# Patient Record
Sex: Female | Born: 1970 | Race: White | Hispanic: No | Marital: Married | Smoking: Current every day smoker
Health system: Southern US, Community
[De-identification: ages and names within clinical notes are randomized; demographics above are authoritative.]

## PROBLEM LIST (undated history)

## (undated) DIAGNOSIS — E039 Hypothyroidism, unspecified: Secondary | ICD-10-CM

## (undated) DIAGNOSIS — Z720 Tobacco use: Secondary | ICD-10-CM

## (undated) DIAGNOSIS — K279 Peptic ulcer, site unspecified, unspecified as acute or chronic, without hemorrhage or perforation: Secondary | ICD-10-CM

## (undated) DIAGNOSIS — Z8639 Personal history of other endocrine, nutritional and metabolic disease: Secondary | ICD-10-CM

## (undated) DIAGNOSIS — IMO0002 Reserved for concepts with insufficient information to code with codable children: Secondary | ICD-10-CM

## (undated) DIAGNOSIS — J358 Other chronic diseases of tonsils and adenoids: Secondary | ICD-10-CM

## (undated) DIAGNOSIS — S82899A Other fracture of unspecified lower leg, initial encounter for closed fracture: Secondary | ICD-10-CM

## (undated) DIAGNOSIS — K589 Irritable bowel syndrome without diarrhea: Secondary | ICD-10-CM

## (undated) DIAGNOSIS — M1990 Unspecified osteoarthritis, unspecified site: Secondary | ICD-10-CM

## (undated) DIAGNOSIS — G4700 Insomnia, unspecified: Secondary | ICD-10-CM

## (undated) DIAGNOSIS — K625 Hemorrhage of anus and rectum: Secondary | ICD-10-CM

## (undated) DIAGNOSIS — G43909 Migraine, unspecified, not intractable, without status migrainosus: Secondary | ICD-10-CM

## (undated) DIAGNOSIS — K219 Gastro-esophageal reflux disease without esophagitis: Secondary | ICD-10-CM

## (undated) DIAGNOSIS — F319 Bipolar disorder, unspecified: Secondary | ICD-10-CM

## (undated) DIAGNOSIS — J449 Chronic obstructive pulmonary disease, unspecified: Secondary | ICD-10-CM

## (undated) DIAGNOSIS — R1031 Right lower quadrant pain: Secondary | ICD-10-CM

## (undated) HISTORY — DX: Migraine, unspecified, not intractable, without status migrainosus: G43.909

## (undated) HISTORY — DX: Bipolar disorder, unspecified: F31.9

## (undated) HISTORY — DX: Gastro-esophageal reflux disease without esophagitis: K21.9

## (undated) HISTORY — DX: Peptic ulcer, site unspecified, unspecified as acute or chronic, without hemorrhage or perforation: K27.9

## (undated) HISTORY — DX: Hemorrhage of anus and rectum: K62.5

## (undated) HISTORY — DX: Hypothyroidism, unspecified: E03.9

## (undated) HISTORY — PX: MOUTH SURGERY: SHX715

## (undated) HISTORY — DX: Reserved for concepts with insufficient information to code with codable children: IMO0002

## (undated) HISTORY — DX: Other fracture of unspecified lower leg, initial encounter for closed fracture: S82.899A

## (undated) HISTORY — DX: Irritable bowel syndrome, unspecified: K58.9

## (undated) HISTORY — DX: Right lower quadrant pain: R10.31

## (undated) HISTORY — DX: Insomnia, unspecified: G47.00

## (undated) HISTORY — DX: Tobacco use: Z72.0

## (undated) HISTORY — DX: Other chronic diseases of tonsils and adenoids: J35.8

## (undated) HISTORY — DX: Personal history of other endocrine, nutritional and metabolic disease: Z86.39

## (undated) HISTORY — DX: Chronic obstructive pulmonary disease, unspecified: J44.9

## (undated) HISTORY — DX: Unspecified osteoarthritis, unspecified site: M19.90

---

## 2001-11-14 HISTORY — PX: ABDOMINAL HYSTERECTOMY: SHX81

## 2001-11-14 HISTORY — PX: CARPAL TUNNEL RELEASE: SHX101

## 2002-01-09 ENCOUNTER — Ambulatory Visit (HOSPITAL_COMMUNITY): Admission: RE | Admit: 2002-01-09 | Discharge: 2002-01-09 | Payer: Self-pay | Admitting: Family Medicine

## 2002-01-09 ENCOUNTER — Encounter: Payer: Self-pay | Admitting: Family Medicine

## 2002-02-02 ENCOUNTER — Emergency Department (HOSPITAL_COMMUNITY): Admission: EM | Admit: 2002-02-02 | Discharge: 2002-02-02 | Payer: Self-pay | Admitting: Emergency Medicine

## 2002-02-02 ENCOUNTER — Encounter: Payer: Self-pay | Admitting: Emergency Medicine

## 2002-02-12 ENCOUNTER — Ambulatory Visit (HOSPITAL_COMMUNITY): Admission: RE | Admit: 2002-02-12 | Discharge: 2002-02-12 | Payer: Self-pay | Admitting: Endocrinology

## 2002-04-19 ENCOUNTER — Ambulatory Visit (HOSPITAL_COMMUNITY): Admission: RE | Admit: 2002-04-19 | Discharge: 2002-04-19 | Payer: Self-pay | Admitting: General Surgery

## 2002-05-20 ENCOUNTER — Emergency Department (HOSPITAL_COMMUNITY): Admission: EM | Admit: 2002-05-20 | Discharge: 2002-05-20 | Payer: Self-pay | Admitting: *Deleted

## 2002-05-21 ENCOUNTER — Emergency Department (HOSPITAL_COMMUNITY): Admission: EM | Admit: 2002-05-21 | Discharge: 2002-05-21 | Payer: Self-pay | Admitting: Emergency Medicine

## 2002-05-22 ENCOUNTER — Ambulatory Visit (HOSPITAL_COMMUNITY): Admission: RE | Admit: 2002-05-22 | Discharge: 2002-05-22 | Payer: Self-pay | Admitting: General Surgery

## 2002-07-18 ENCOUNTER — Encounter: Payer: Self-pay | Admitting: Family Medicine

## 2002-07-18 ENCOUNTER — Ambulatory Visit (HOSPITAL_COMMUNITY): Admission: RE | Admit: 2002-07-18 | Discharge: 2002-07-18 | Payer: Self-pay | Admitting: Family Medicine

## 2002-08-07 ENCOUNTER — Encounter: Payer: Self-pay | Admitting: Family Medicine

## 2002-08-07 ENCOUNTER — Ambulatory Visit (HOSPITAL_COMMUNITY): Admission: RE | Admit: 2002-08-07 | Discharge: 2002-08-07 | Payer: Self-pay | Admitting: Family Medicine

## 2002-08-09 ENCOUNTER — Inpatient Hospital Stay (HOSPITAL_COMMUNITY): Admission: RE | Admit: 2002-08-09 | Discharge: 2002-08-10 | Payer: Self-pay | Admitting: General Surgery

## 2003-01-27 ENCOUNTER — Encounter: Payer: Self-pay | Admitting: Family Medicine

## 2003-01-27 ENCOUNTER — Ambulatory Visit (HOSPITAL_COMMUNITY): Admission: RE | Admit: 2003-01-27 | Discharge: 2003-01-27 | Payer: Self-pay | Admitting: Family Medicine

## 2003-02-03 ENCOUNTER — Ambulatory Visit (HOSPITAL_COMMUNITY): Admission: RE | Admit: 2003-02-03 | Discharge: 2003-02-03 | Payer: Self-pay | Admitting: *Deleted

## 2003-03-13 ENCOUNTER — Encounter: Payer: Self-pay | Admitting: Emergency Medicine

## 2003-03-13 ENCOUNTER — Emergency Department (HOSPITAL_COMMUNITY): Admission: EM | Admit: 2003-03-13 | Discharge: 2003-03-13 | Payer: Self-pay | Admitting: Emergency Medicine

## 2003-03-18 ENCOUNTER — Ambulatory Visit (HOSPITAL_COMMUNITY): Admission: RE | Admit: 2003-03-18 | Discharge: 2003-03-18 | Payer: Self-pay | Admitting: *Deleted

## 2003-04-25 ENCOUNTER — Encounter (HOSPITAL_COMMUNITY): Admission: RE | Admit: 2003-04-25 | Discharge: 2003-05-25 | Payer: Self-pay | Admitting: Family Medicine

## 2003-04-25 ENCOUNTER — Encounter: Payer: Self-pay | Admitting: Family Medicine

## 2003-07-22 ENCOUNTER — Ambulatory Visit (HOSPITAL_COMMUNITY): Admission: RE | Admit: 2003-07-22 | Discharge: 2003-07-22 | Payer: Self-pay | Admitting: Family Medicine

## 2003-07-22 ENCOUNTER — Encounter: Payer: Self-pay | Admitting: Family Medicine

## 2003-08-06 ENCOUNTER — Emergency Department (HOSPITAL_COMMUNITY): Admission: EM | Admit: 2003-08-06 | Discharge: 2003-08-06 | Payer: Self-pay | Admitting: *Deleted

## 2003-08-06 ENCOUNTER — Encounter: Payer: Self-pay | Admitting: *Deleted

## 2003-08-12 ENCOUNTER — Ambulatory Visit (HOSPITAL_COMMUNITY): Admission: RE | Admit: 2003-08-12 | Discharge: 2003-08-12 | Payer: Self-pay | Admitting: General Surgery

## 2003-08-28 ENCOUNTER — Emergency Department (HOSPITAL_COMMUNITY): Admission: EM | Admit: 2003-08-28 | Discharge: 2003-08-28 | Payer: Self-pay | Admitting: Emergency Medicine

## 2003-08-29 ENCOUNTER — Emergency Department (HOSPITAL_COMMUNITY): Admission: EM | Admit: 2003-08-29 | Discharge: 2003-08-29 | Payer: Self-pay | Admitting: Emergency Medicine

## 2003-09-25 ENCOUNTER — Ambulatory Visit (HOSPITAL_COMMUNITY): Admission: RE | Admit: 2003-09-25 | Discharge: 2003-09-25 | Payer: Self-pay | Admitting: Unknown Physician Specialty

## 2003-11-15 HISTORY — PX: THYROIDECTOMY: SHX17

## 2003-11-15 HISTORY — PX: BILATERAL SALPINGOOPHORECTOMY: SHX1223

## 2003-11-19 ENCOUNTER — Emergency Department (HOSPITAL_COMMUNITY): Admission: EM | Admit: 2003-11-19 | Discharge: 2003-11-19 | Payer: Self-pay | Admitting: Emergency Medicine

## 2003-12-05 ENCOUNTER — Ambulatory Visit (HOSPITAL_COMMUNITY): Admission: RE | Admit: 2003-12-05 | Discharge: 2003-12-05 | Payer: Self-pay | Admitting: Family Medicine

## 2003-12-16 ENCOUNTER — Ambulatory Visit (HOSPITAL_COMMUNITY): Admission: RE | Admit: 2003-12-16 | Discharge: 2003-12-16 | Payer: Self-pay | Admitting: General Surgery

## 2003-12-19 ENCOUNTER — Inpatient Hospital Stay (HOSPITAL_COMMUNITY): Admission: RE | Admit: 2003-12-19 | Discharge: 2003-12-20 | Payer: Self-pay | Admitting: *Deleted

## 2004-01-27 ENCOUNTER — Inpatient Hospital Stay (HOSPITAL_COMMUNITY): Admission: RE | Admit: 2004-01-27 | Discharge: 2004-01-27 | Payer: Self-pay | Admitting: General Surgery

## 2004-04-26 IMAGING — NM NM THYROID IMAGING W/ UPTAKE SINGLE (24 HR)
4 series · 4 of 4 positions shown · non-contrast
Comparison: none

CLINICAL DATA: Hx of hyperthyroidism and goiter.
THYROID UPTAKE AND SCAN
Thyroid uptake and scan was performed after the intravenous administration of approximately 10.0 mCi of Qc99m Pertechnetate and oral administration of 100 uCi of Iodine 123.  Technetium visual uptake estimate in reference to the salivary gland and 24 hour Iodine 123 uptake values were performed.  Thyroid scan in the anterior LAO and RAO projections was performed with pinhole images.
The Technetium visual uptake estimate was approximately 40%.  The 24 hour iodine 123 uptake was 21.6%.  The discrepancy could be related to a recent thyroiditis.  The images of the thyroid glands show fairly homogeneous uptake on the left side; however, there is decreased uptake in the right lower pole and clinical correlation and possible correlation with fine needle aspiration biopsy is recommended.  Both glands are enlarged measuring 6-7 cm in diameter anteriorly. 
IMPRESSION
Enlarged glands bilaterally with normal uptake of 21.6%.  However, there is a discrepancy between the Technetium and Iodine uptake which could be related to recent thyroiditis.  There is an area of decreased uptake in the right lower pole which needs clinical correlation and possible correlation with fine needle aspiration biopsy.

[Series 1: th thyroid scan · 0.54mm/px · 1 of 1 slices shown (1 of 4)]
[im 1/1]
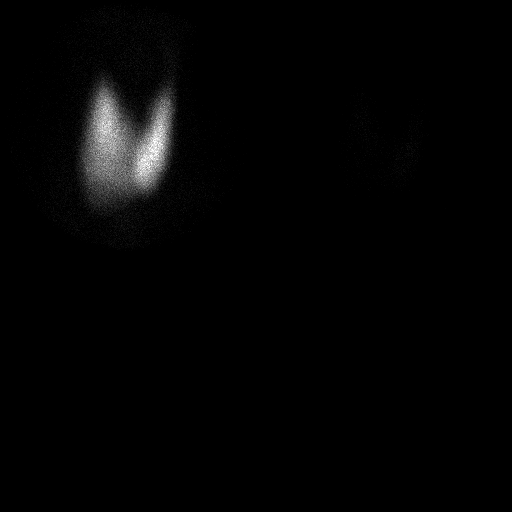

[Series 1: th thyroid scan · 0.54mm/px · 1 of 1 slices shown (2 of 4)]
[im 1/1]
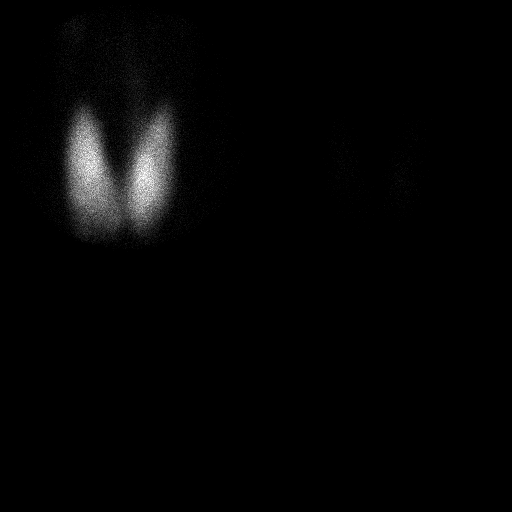

[Series 1: th thyroid scan · 0.54mm/px · 1 of 1 slices shown (3 of 4)]
[im 1/1]
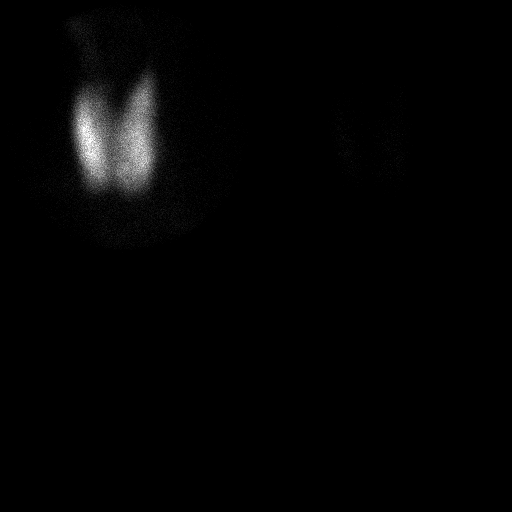

[Series 1: th thyroid scan · 2.39mm/px · 1 of 1 slices shown (4 of 4)]
[im 1/1]
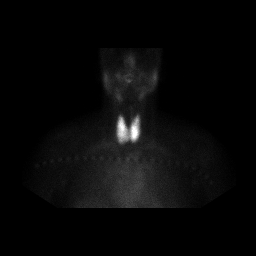

[4 of 4 positions shown; findings below may reference images not displayed]

## 2004-06-28 ENCOUNTER — Ambulatory Visit (HOSPITAL_COMMUNITY): Admission: RE | Admit: 2004-06-28 | Discharge: 2004-06-28 | Payer: Self-pay | Admitting: Family Medicine

## 2004-11-06 IMAGING — US US ABDOMEN COMPLETE
1 series · 14 of 25 positions shown · non-contrast
Comparison: none

CLINICAL DATA: Abdominal pain. 
 ABDOMEN ULTRASOUND COMPLETE 
 Gallbladder physiologically distended without stones or wall thickening.  Common bile duct normal caliber 4 mm diameter.  Liver, pancreas and kidneys normal appearance, right kidney 12.2 cm length and left kidney 13.3 cm.  Aorta and IVC unremarkable.  No free fluid.  Upper normal sized spleen 13.3 cm length.  
 IMPRESSION
 Question mild splenic prominence.  Otherwise negative exam.

[Series 1: unknown · 0.34mm/px · 14 of 65 slices shown]
[im 1/65]
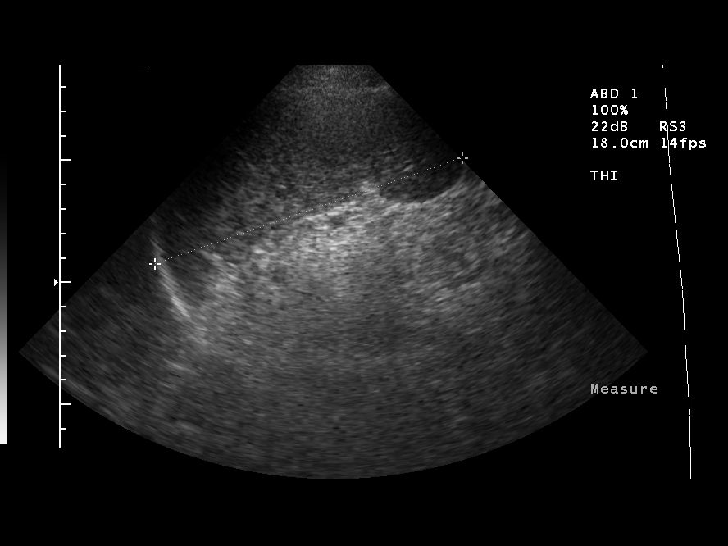
[im 6/65]
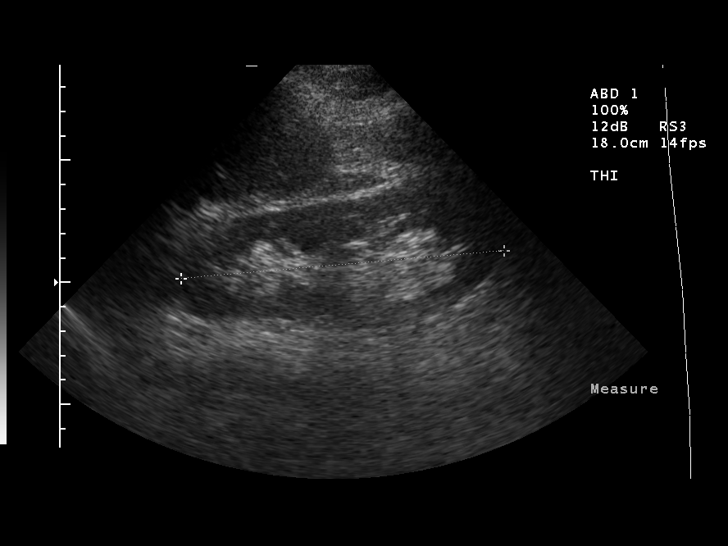
[im 11/65]
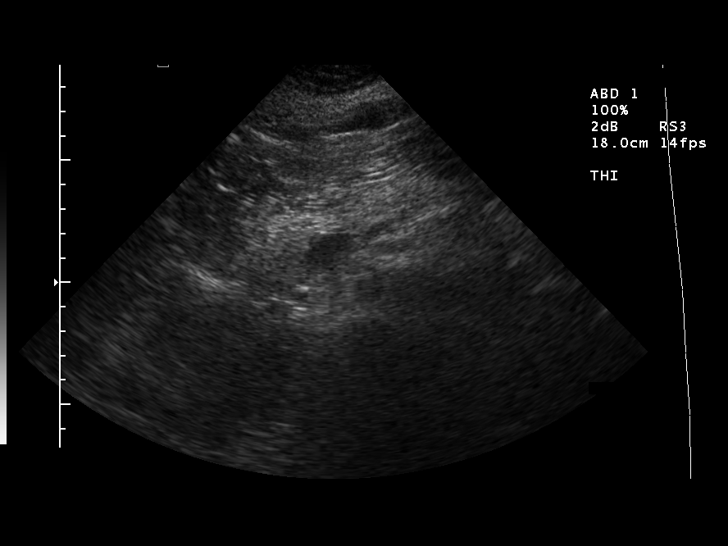
[im 17/65]
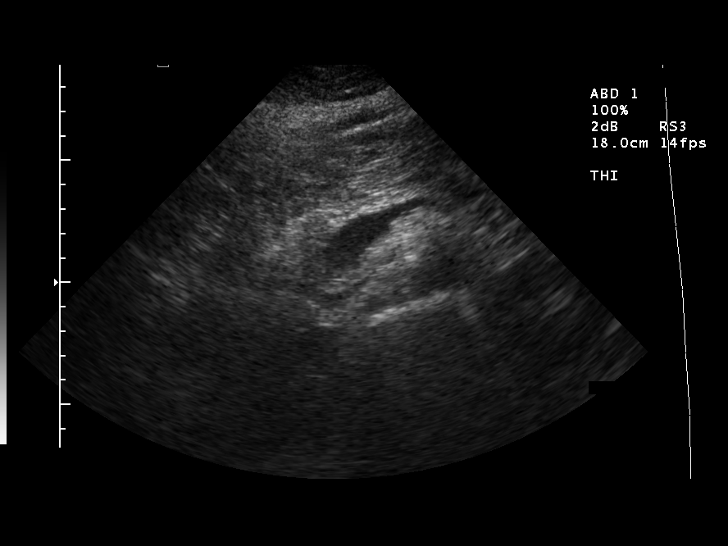
[im 22/65]
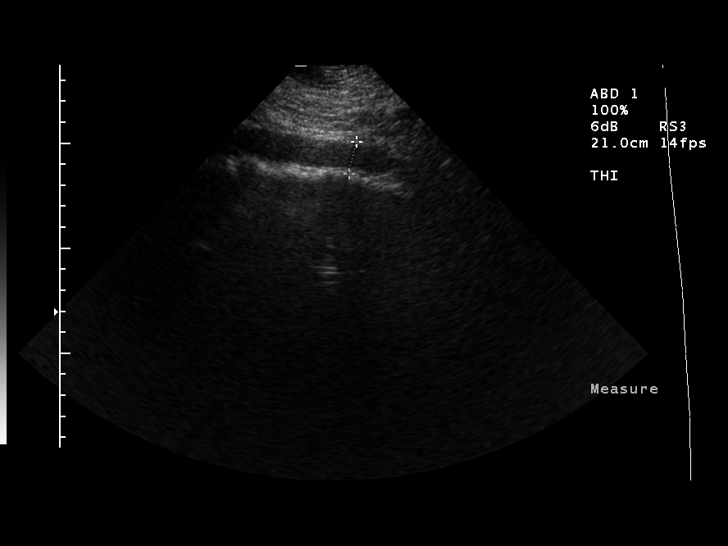
[im 25/65]
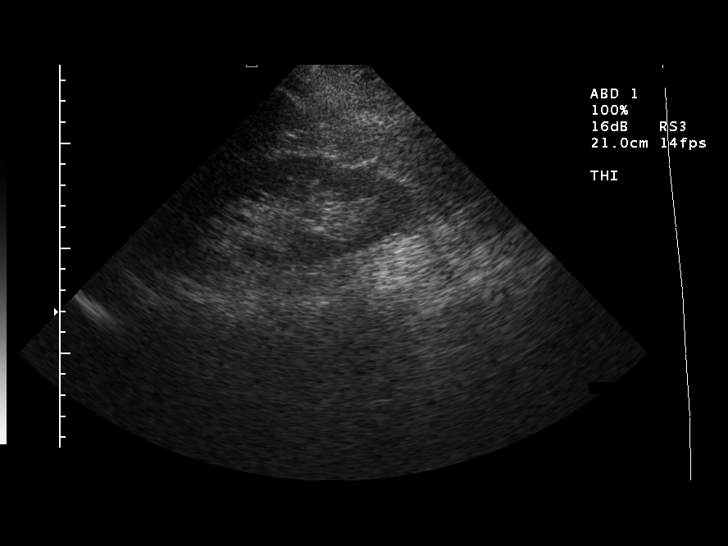
[im 30/65]
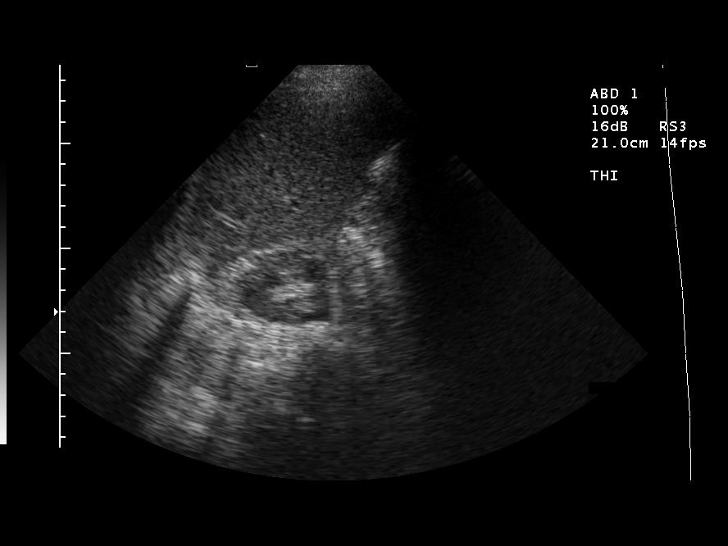
[im 35/65]
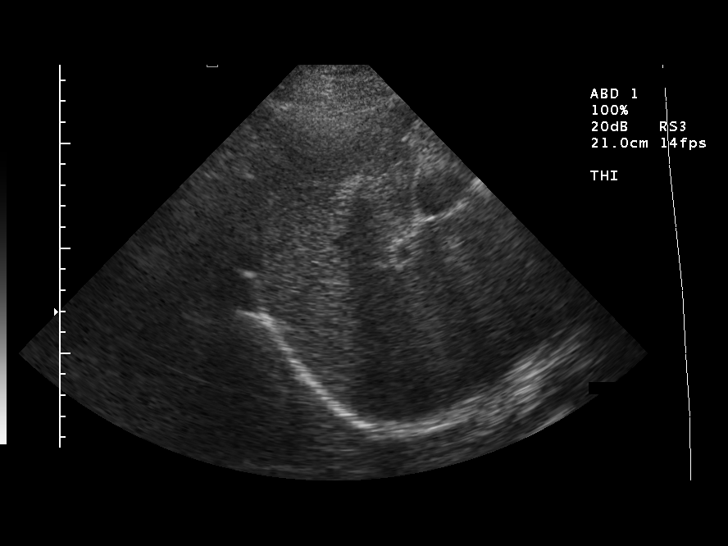
[im 41/65]
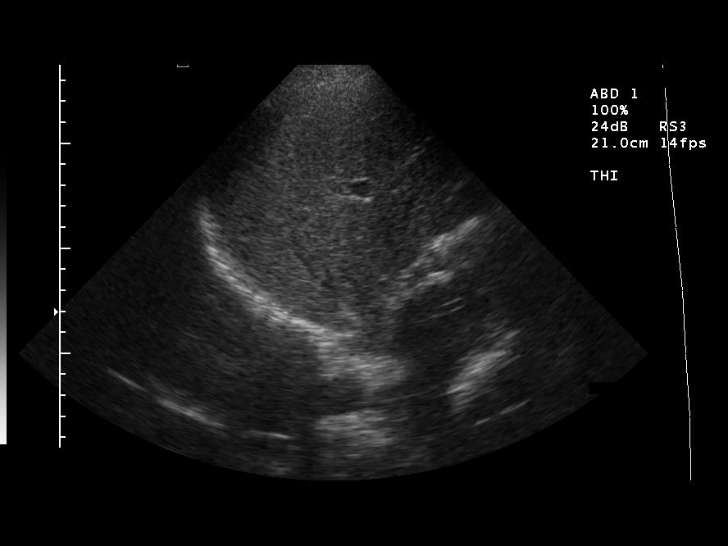
[im 43/65]
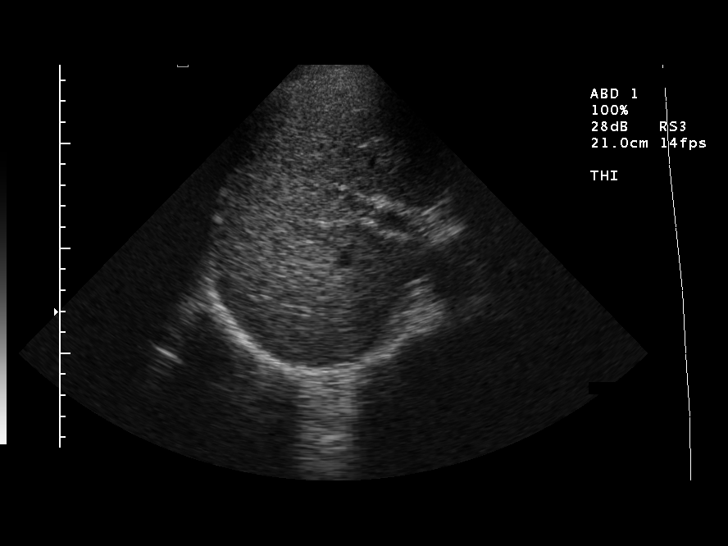
[im 49/65]
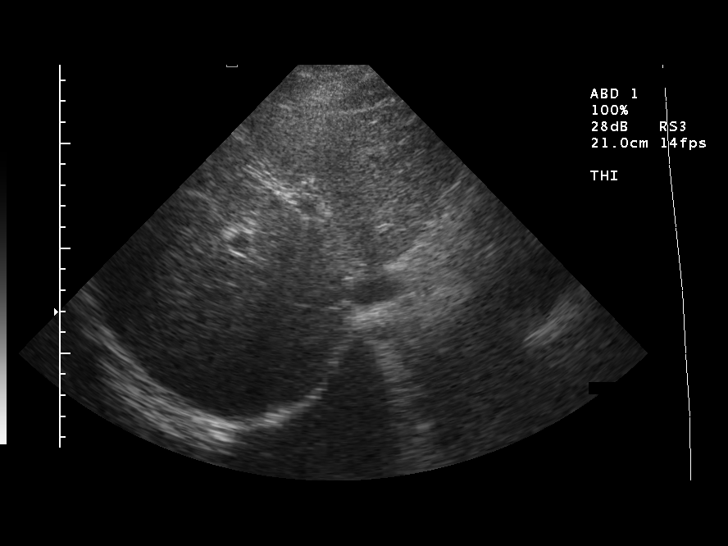
[im 54/65]
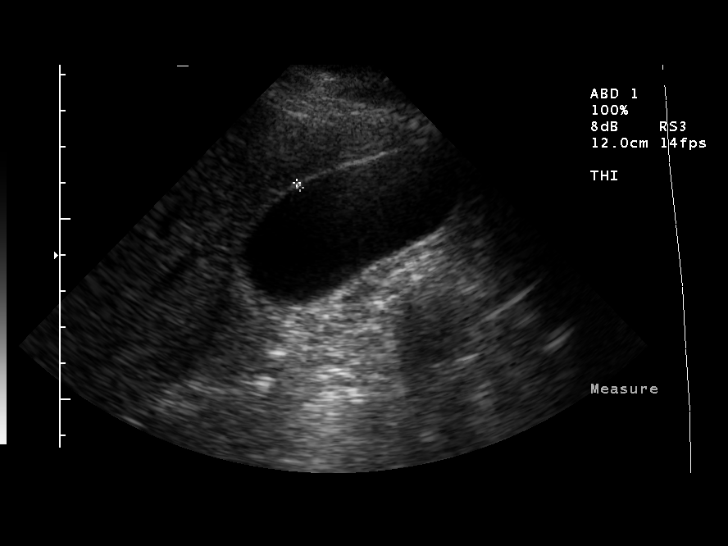
[im 59/65]
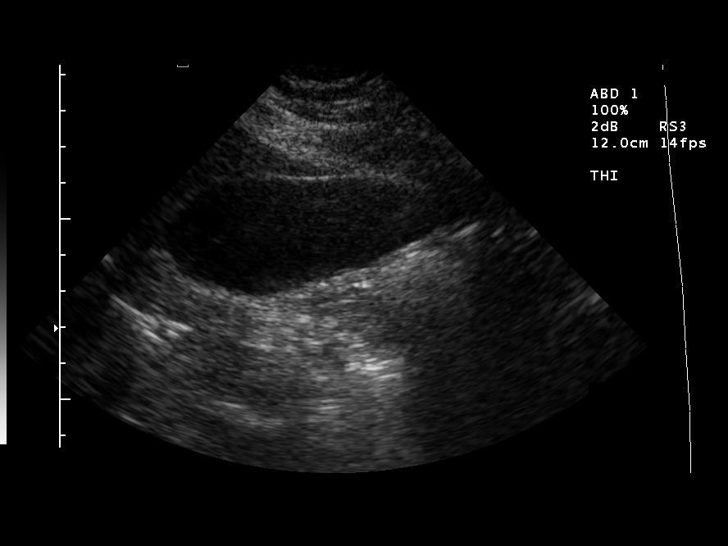
[im 65/65]
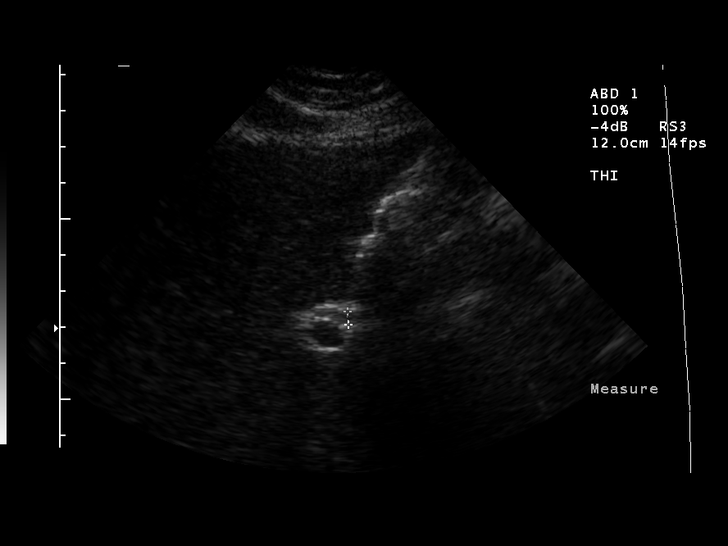

[14 of 25 positions shown; findings below may reference images not displayed]

## 2004-11-19 ENCOUNTER — Ambulatory Visit (HOSPITAL_COMMUNITY): Admission: RE | Admit: 2004-11-19 | Discharge: 2004-11-19 | Payer: Self-pay | Admitting: Family Medicine

## 2005-01-24 ENCOUNTER — Emergency Department (HOSPITAL_COMMUNITY): Admission: EM | Admit: 2005-01-24 | Discharge: 2005-01-24 | Payer: Self-pay | Admitting: Emergency Medicine

## 2005-02-03 ENCOUNTER — Ambulatory Visit: Payer: Self-pay | Admitting: Orthopedic Surgery

## 2005-02-10 ENCOUNTER — Ambulatory Visit (HOSPITAL_COMMUNITY): Admission: RE | Admit: 2005-02-10 | Discharge: 2005-02-10 | Payer: Self-pay | Admitting: Orthopedic Surgery

## 2005-02-16 ENCOUNTER — Ambulatory Visit: Payer: Self-pay | Admitting: Orthopedic Surgery

## 2005-04-15 ENCOUNTER — Ambulatory Visit: Payer: Self-pay | Admitting: Gastroenterology

## 2005-05-19 ENCOUNTER — Ambulatory Visit (HOSPITAL_COMMUNITY): Admission: RE | Admit: 2005-05-19 | Discharge: 2005-05-19 | Payer: Self-pay | Admitting: Internal Medicine

## 2005-05-19 ENCOUNTER — Ambulatory Visit: Payer: Self-pay | Admitting: Internal Medicine

## 2006-01-04 ENCOUNTER — Ambulatory Visit (HOSPITAL_COMMUNITY): Admission: RE | Admit: 2006-01-04 | Discharge: 2006-01-04 | Payer: Self-pay | Admitting: Family Medicine

## 2006-02-24 ENCOUNTER — Emergency Department (HOSPITAL_COMMUNITY): Admission: EM | Admit: 2006-02-24 | Discharge: 2006-02-25 | Payer: Self-pay | Admitting: Emergency Medicine

## 2006-05-12 ENCOUNTER — Emergency Department (HOSPITAL_COMMUNITY): Admission: EM | Admit: 2006-05-12 | Discharge: 2006-05-12 | Payer: Self-pay | Admitting: Emergency Medicine

## 2006-06-14 ENCOUNTER — Emergency Department (HOSPITAL_COMMUNITY): Admission: EM | Admit: 2006-06-14 | Discharge: 2006-06-14 | Payer: Self-pay | Admitting: Emergency Medicine

## 2006-06-16 ENCOUNTER — Emergency Department (HOSPITAL_COMMUNITY): Admission: EM | Admit: 2006-06-16 | Discharge: 2006-06-16 | Payer: Self-pay | Admitting: Emergency Medicine

## 2006-07-23 ENCOUNTER — Emergency Department (HOSPITAL_COMMUNITY): Admission: EM | Admit: 2006-07-23 | Discharge: 2006-07-23 | Payer: Self-pay | Admitting: Emergency Medicine

## 2006-09-06 ENCOUNTER — Encounter: Payer: Self-pay | Admitting: Nurse Practitioner

## 2006-11-14 HISTORY — PX: MULTIPLE TOOTH EXTRACTIONS: SHX2053

## 2006-12-12 ENCOUNTER — Emergency Department (HOSPITAL_COMMUNITY): Admission: EM | Admit: 2006-12-12 | Discharge: 2006-12-12 | Payer: Self-pay | Admitting: Emergency Medicine

## 2007-01-08 ENCOUNTER — Emergency Department (HOSPITAL_COMMUNITY): Admission: EM | Admit: 2007-01-08 | Discharge: 2007-01-09 | Payer: Self-pay | Admitting: Emergency Medicine

## 2007-01-09 ENCOUNTER — Ambulatory Visit (HOSPITAL_COMMUNITY): Admission: RE | Admit: 2007-01-09 | Discharge: 2007-01-09 | Payer: Self-pay | Admitting: Emergency Medicine

## 2007-02-05 ENCOUNTER — Emergency Department (HOSPITAL_COMMUNITY): Admission: EM | Admit: 2007-02-05 | Discharge: 2007-02-05 | Payer: Self-pay | Admitting: Emergency Medicine

## 2007-03-09 ENCOUNTER — Emergency Department (HOSPITAL_COMMUNITY): Admission: EM | Admit: 2007-03-09 | Discharge: 2007-03-09 | Payer: Self-pay | Admitting: Emergency Medicine

## 2007-04-04 ENCOUNTER — Emergency Department (HOSPITAL_COMMUNITY): Admission: EM | Admit: 2007-04-04 | Discharge: 2007-04-04 | Payer: Self-pay | Admitting: Emergency Medicine

## 2007-04-11 ENCOUNTER — Emergency Department (HOSPITAL_COMMUNITY): Admission: EM | Admit: 2007-04-11 | Discharge: 2007-04-11 | Payer: Self-pay | Admitting: Emergency Medicine

## 2007-05-15 ENCOUNTER — Emergency Department (HOSPITAL_COMMUNITY): Admission: EM | Admit: 2007-05-15 | Discharge: 2007-05-15 | Payer: Self-pay | Admitting: Emergency Medicine

## 2007-08-06 ENCOUNTER — Emergency Department (HOSPITAL_COMMUNITY): Admission: EM | Admit: 2007-08-06 | Discharge: 2007-08-06 | Payer: Self-pay | Admitting: Emergency Medicine

## 2007-11-15 ENCOUNTER — Encounter: Payer: Self-pay | Admitting: Family Medicine

## 2007-12-07 ENCOUNTER — Emergency Department (HOSPITAL_COMMUNITY): Admission: EM | Admit: 2007-12-07 | Discharge: 2007-12-07 | Payer: Self-pay | Admitting: Emergency Medicine

## 2007-12-14 ENCOUNTER — Ambulatory Visit (HOSPITAL_COMMUNITY): Admission: RE | Admit: 2007-12-14 | Discharge: 2007-12-14 | Payer: Self-pay | Admitting: Orthopaedic Surgery

## 2008-06-28 ENCOUNTER — Emergency Department (HOSPITAL_COMMUNITY): Admission: EM | Admit: 2008-06-28 | Discharge: 2008-06-28 | Payer: Self-pay | Admitting: Emergency Medicine

## 2008-07-29 ENCOUNTER — Emergency Department (HOSPITAL_COMMUNITY): Admission: EM | Admit: 2008-07-29 | Discharge: 2008-07-29 | Payer: Self-pay | Admitting: Emergency Medicine

## 2008-09-16 ENCOUNTER — Ambulatory Visit: Payer: Self-pay | Admitting: Family Medicine

## 2008-09-16 DIAGNOSIS — F329 Major depressive disorder, single episode, unspecified: Secondary | ICD-10-CM | POA: Insufficient documentation

## 2008-09-16 DIAGNOSIS — N3020 Other chronic cystitis without hematuria: Secondary | ICD-10-CM | POA: Insufficient documentation

## 2008-09-16 DIAGNOSIS — M159 Polyosteoarthritis, unspecified: Secondary | ICD-10-CM

## 2008-09-16 DIAGNOSIS — R079 Chest pain, unspecified: Secondary | ICD-10-CM

## 2008-09-16 DIAGNOSIS — R1031 Right lower quadrant pain: Secondary | ICD-10-CM

## 2008-09-16 DIAGNOSIS — J018 Other acute sinusitis: Secondary | ICD-10-CM

## 2008-09-17 ENCOUNTER — Ambulatory Visit (HOSPITAL_COMMUNITY): Admission: RE | Admit: 2008-09-17 | Discharge: 2008-09-17 | Payer: Self-pay | Admitting: Family Medicine

## 2008-09-17 ENCOUNTER — Encounter: Payer: Self-pay | Admitting: Family Medicine

## 2008-09-18 LAB — CONVERTED CEMR LAB
ALT: 11 units/L (ref 0–35)
Alkaline Phosphatase: 104 units/L (ref 39–117)
BUN: 15 mg/dL (ref 6–23)
Basophils Relative: 1 % (ref 0–1)
Bilirubin, Direct: 0.1 mg/dL (ref 0.0–0.3)
Cholesterol: 267 mg/dL — ABNORMAL HIGH (ref 0–200)
Creatinine, Ser: 0.73 mg/dL (ref 0.40–1.20)
Eosinophils Absolute: 0.1 10*3/uL (ref 0.0–0.7)
Eosinophils Relative: 2 % (ref 0–5)
Glucose, Bld: 92 mg/dL (ref 70–99)
HCT: 41.7 % (ref 36.0–46.0)
Hemoglobin: 12.9 g/dL (ref 12.0–15.0)
Indirect Bilirubin: 0.4 mg/dL (ref 0.0–0.9)
Lymphs Abs: 1.6 10*3/uL (ref 0.7–4.0)
MCHC: 30.9 g/dL (ref 30.0–36.0)
MCV: 80.7 fL (ref 78.0–100.0)
Monocytes Absolute: 0.4 10*3/uL (ref 0.1–1.0)
Monocytes Relative: 7 % (ref 3–12)
Neutrophils Relative %: 60 % (ref 43–77)
Potassium: 4 meq/L (ref 3.5–5.3)
RBC: 5.17 M/uL — ABNORMAL HIGH (ref 3.87–5.11)
VLDL: 26 mg/dL (ref 0–40)
WBC: 5.3 10*3/uL (ref 4.0–10.5)

## 2008-09-23 ENCOUNTER — Encounter: Payer: Self-pay | Admitting: Family Medicine

## 2009-01-15 ENCOUNTER — Ambulatory Visit: Payer: Self-pay | Admitting: Family Medicine

## 2009-01-15 DIAGNOSIS — J449 Chronic obstructive pulmonary disease, unspecified: Secondary | ICD-10-CM

## 2009-01-15 DIAGNOSIS — J4489 Other specified chronic obstructive pulmonary disease: Secondary | ICD-10-CM | POA: Insufficient documentation

## 2009-01-19 DIAGNOSIS — R42 Dizziness and giddiness: Secondary | ICD-10-CM

## 2009-01-19 DIAGNOSIS — J42 Unspecified chronic bronchitis: Secondary | ICD-10-CM | POA: Insufficient documentation

## 2009-01-19 DIAGNOSIS — F17200 Nicotine dependence, unspecified, uncomplicated: Secondary | ICD-10-CM | POA: Insufficient documentation

## 2009-01-26 ENCOUNTER — Encounter: Payer: Self-pay | Admitting: Family Medicine

## 2009-01-26 LAB — CONVERTED CEMR LAB
ALT: 10 units/L (ref 0–35)
Cholesterol: 227 mg/dL — ABNORMAL HIGH (ref 0–200)
LDL Cholesterol: 146 mg/dL — ABNORMAL HIGH (ref 0–99)
Total CHOL/HDL Ratio: 6.3
Total Protein: 6.8 g/dL (ref 6.0–8.3)
Triglycerides: 223 mg/dL — ABNORMAL HIGH (ref <150)
VLDL: 45 mg/dL — ABNORMAL HIGH (ref 0–40)

## 2009-01-26 IMAGING — CR DG CHEST 2V
2 series · 2 of 2 positions shown · non-contrast
Comparison: 04/04/2007

CLINICAL DATA: Bronchitis

CHEST - 2 VIEW

[view not recorded (1 of 2)]
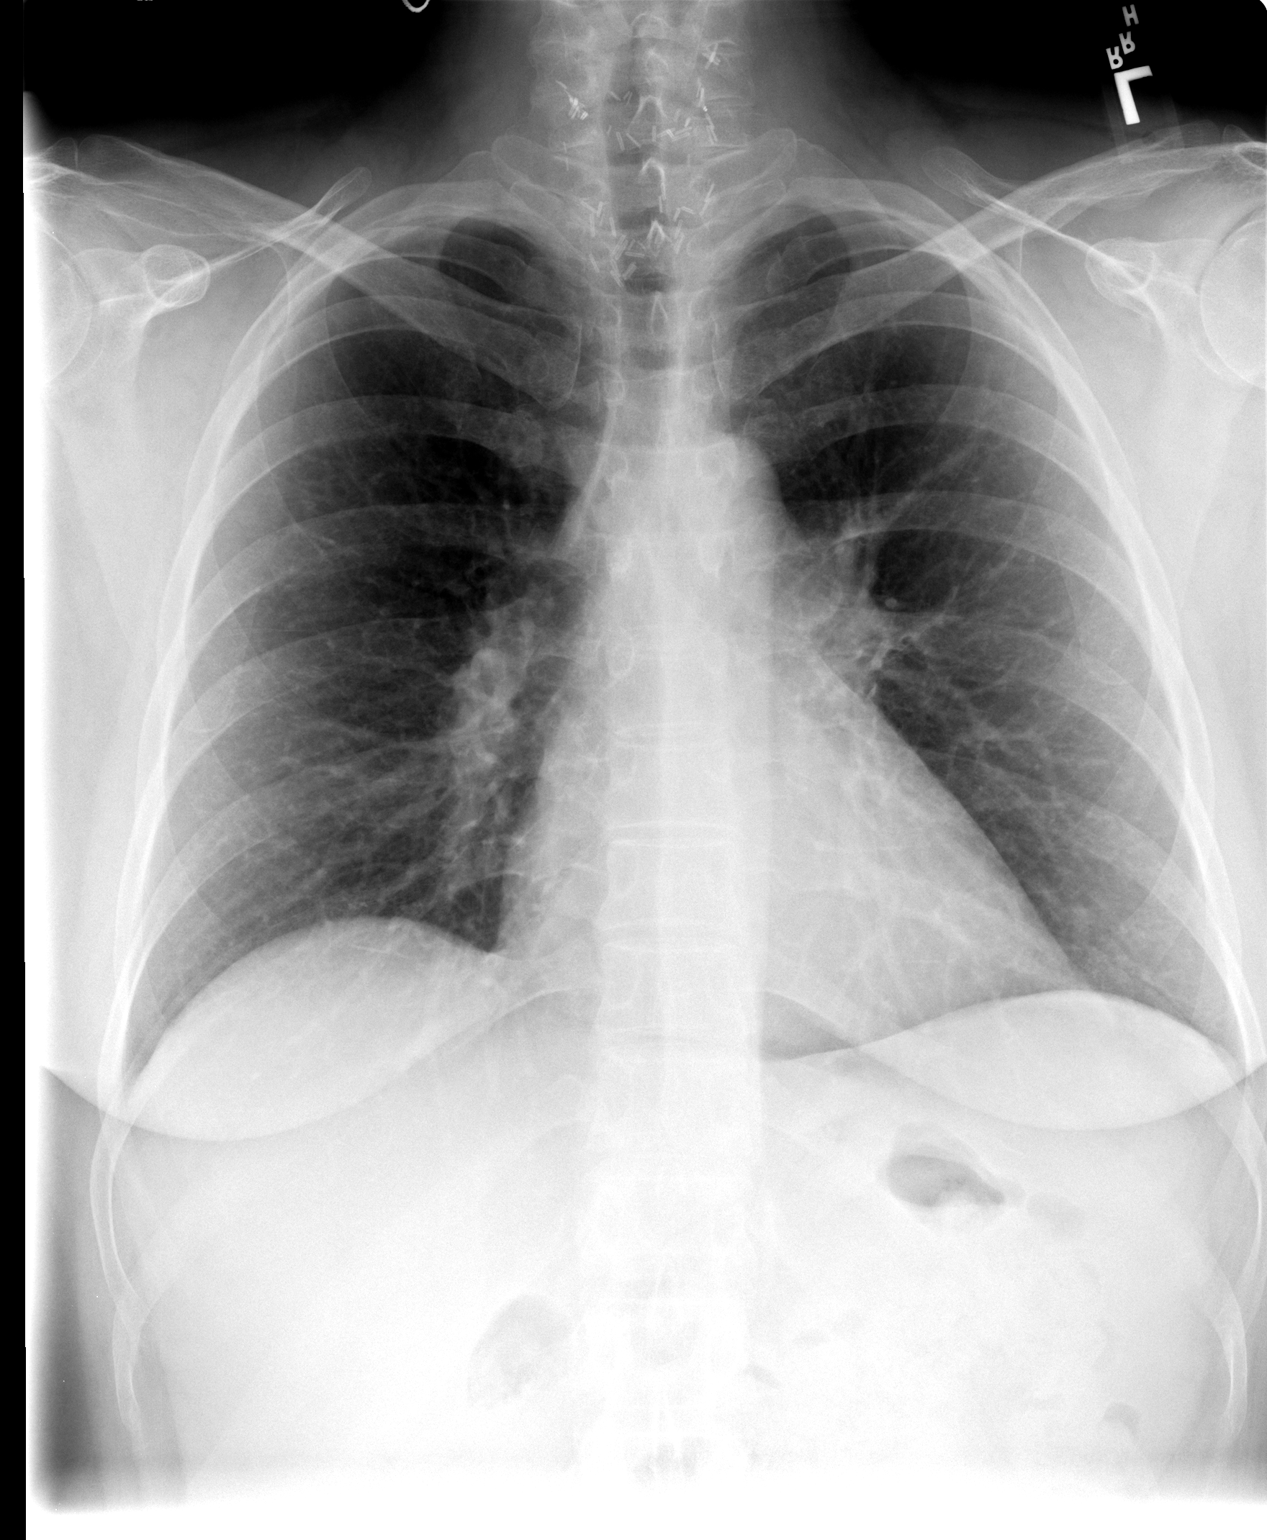

[view not recorded (2 of 2)]
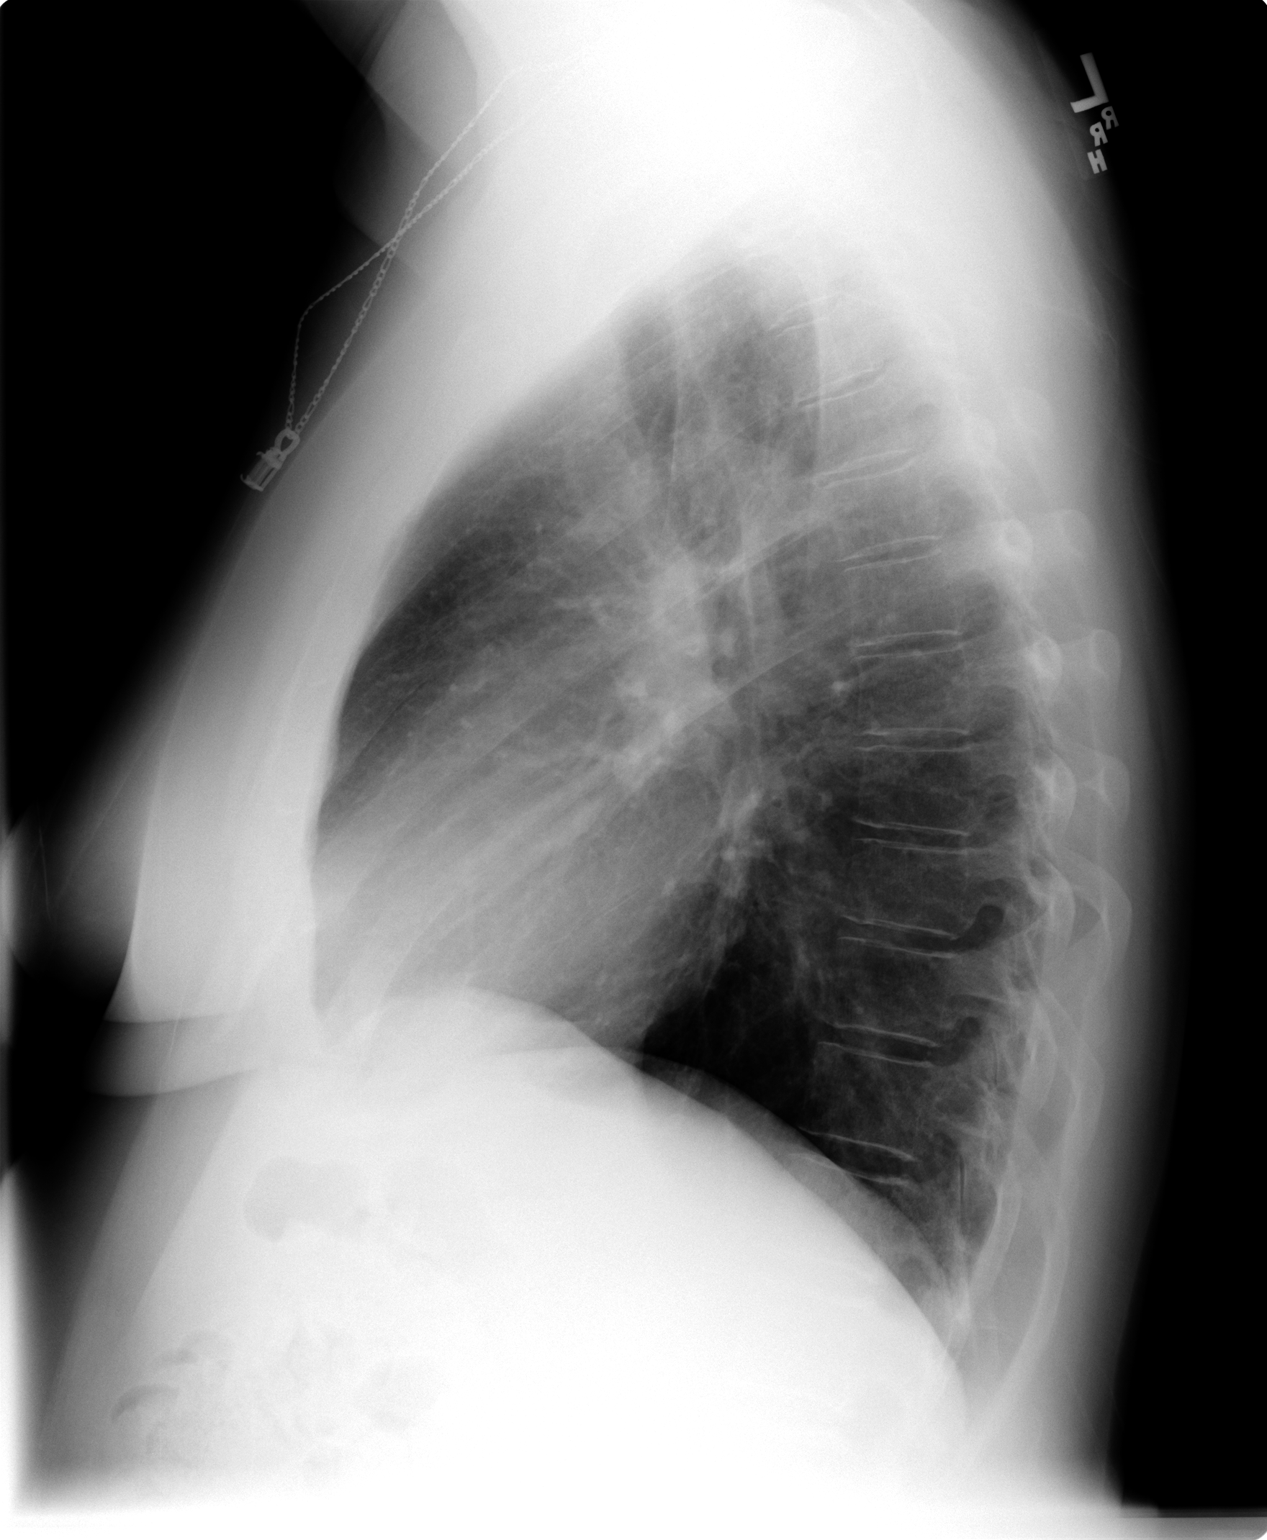

[2 of 2 positions shown; findings below may reference images not displayed]

FINDINGS: The heart size and mediastinal contours are within normal
limits.  Both lungs are clear.  The visualized skeletal structures
are unremarkable.
IMPRESSION: No active cardiopulmonary disease.

## 2009-02-04 ENCOUNTER — Telehealth: Payer: Self-pay | Admitting: Family Medicine

## 2009-02-12 ENCOUNTER — Telehealth: Payer: Self-pay | Admitting: Family Medicine

## 2009-02-18 ENCOUNTER — Encounter: Payer: Self-pay | Admitting: Family Medicine

## 2009-02-18 ENCOUNTER — Ambulatory Visit: Admission: RE | Admit: 2009-02-18 | Discharge: 2009-02-18 | Payer: Self-pay | Admitting: Family Medicine

## 2009-02-20 ENCOUNTER — Encounter: Payer: Self-pay | Admitting: Family Medicine

## 2009-02-23 ENCOUNTER — Telehealth: Payer: Self-pay | Admitting: Family Medicine

## 2009-03-16 ENCOUNTER — Telehealth: Payer: Self-pay | Admitting: Family Medicine

## 2009-03-30 ENCOUNTER — Telehealth: Payer: Self-pay | Admitting: Family Medicine

## 2009-04-22 ENCOUNTER — Ambulatory Visit: Payer: Self-pay | Admitting: Family Medicine

## 2009-04-22 DIAGNOSIS — M797 Fibromyalgia: Secondary | ICD-10-CM | POA: Insufficient documentation

## 2009-04-22 LAB — CONVERTED CEMR LAB: Blood Glucose, Fasting: 102 mg/dL

## 2009-04-28 ENCOUNTER — Telehealth (INDEPENDENT_AMBULATORY_CARE_PROVIDER_SITE_OTHER): Payer: Self-pay

## 2009-05-04 ENCOUNTER — Telehealth: Payer: Self-pay | Admitting: Family Medicine

## 2009-05-08 ENCOUNTER — Encounter: Payer: Self-pay | Admitting: Family Medicine

## 2009-05-20 ENCOUNTER — Encounter (INDEPENDENT_AMBULATORY_CARE_PROVIDER_SITE_OTHER): Payer: Self-pay | Admitting: *Deleted

## 2009-05-20 LAB — CONVERTED CEMR LAB
ALT: 11 units/L
AST: 14 units/L
Alkaline Phosphatase: 76 units/L
Chloride: 104 meq/L
HDL: 44 mg/dL
LDL Cholesterol: 173 mg/dL
Potassium: 3 meq/L
Sodium: 142 meq/L
Total Protein: 7.8 g/dL
WBC: 7.1 10*3/uL

## 2009-05-25 ENCOUNTER — Ambulatory Visit (HOSPITAL_COMMUNITY): Admission: RE | Admit: 2009-05-25 | Discharge: 2009-05-25 | Payer: Self-pay | Admitting: Psychiatry

## 2009-07-16 DIAGNOSIS — K625 Hemorrhage of anus and rectum: Secondary | ICD-10-CM | POA: Insufficient documentation

## 2009-07-16 DIAGNOSIS — Z862 Personal history of diseases of the blood and blood-forming organs and certain disorders involving the immune mechanism: Secondary | ICD-10-CM

## 2009-07-16 DIAGNOSIS — K589 Irritable bowel syndrome without diarrhea: Secondary | ICD-10-CM

## 2009-07-16 DIAGNOSIS — Z8639 Personal history of other endocrine, nutritional and metabolic disease: Secondary | ICD-10-CM

## 2009-07-16 DIAGNOSIS — J45909 Unspecified asthma, uncomplicated: Secondary | ICD-10-CM | POA: Insufficient documentation

## 2009-07-16 DIAGNOSIS — G43909 Migraine, unspecified, not intractable, without status migrainosus: Secondary | ICD-10-CM

## 2009-07-16 DIAGNOSIS — F319 Bipolar disorder, unspecified: Secondary | ICD-10-CM

## 2009-07-16 DIAGNOSIS — K219 Gastro-esophageal reflux disease without esophagitis: Secondary | ICD-10-CM | POA: Insufficient documentation

## 2009-07-16 DIAGNOSIS — I10 Essential (primary) hypertension: Secondary | ICD-10-CM | POA: Insufficient documentation

## 2009-07-17 ENCOUNTER — Ambulatory Visit: Payer: Self-pay | Admitting: Internal Medicine

## 2009-07-17 DIAGNOSIS — R1013 Epigastric pain: Secondary | ICD-10-CM | POA: Insufficient documentation

## 2009-07-17 DIAGNOSIS — R1319 Other dysphagia: Secondary | ICD-10-CM

## 2009-07-21 ENCOUNTER — Encounter: Payer: Self-pay | Admitting: Internal Medicine

## 2009-07-23 ENCOUNTER — Encounter: Payer: Self-pay | Admitting: Family Medicine

## 2009-07-23 ENCOUNTER — Encounter: Payer: Self-pay | Admitting: Internal Medicine

## 2009-08-05 ENCOUNTER — Encounter: Payer: Self-pay | Admitting: Family Medicine

## 2009-08-07 ENCOUNTER — Encounter: Payer: Self-pay | Admitting: Family Medicine

## 2009-08-17 ENCOUNTER — Ambulatory Visit: Payer: Self-pay | Admitting: Cardiology

## 2009-08-17 DIAGNOSIS — E782 Mixed hyperlipidemia: Secondary | ICD-10-CM | POA: Insufficient documentation

## 2009-08-26 ENCOUNTER — Encounter (HOSPITAL_COMMUNITY): Admission: RE | Admit: 2009-08-26 | Discharge: 2009-09-25 | Payer: Self-pay | Admitting: Cardiology

## 2009-08-26 ENCOUNTER — Ambulatory Visit: Payer: Self-pay | Admitting: Cardiology

## 2009-08-26 ENCOUNTER — Encounter: Payer: Self-pay | Admitting: Cardiology

## 2009-08-31 ENCOUNTER — Encounter (INDEPENDENT_AMBULATORY_CARE_PROVIDER_SITE_OTHER): Payer: Self-pay | Admitting: *Deleted

## 2009-09-07 ENCOUNTER — Encounter: Payer: Self-pay | Admitting: Cardiology

## 2009-09-18 ENCOUNTER — Encounter: Payer: Self-pay | Admitting: Cardiology

## 2009-09-18 ENCOUNTER — Encounter (INDEPENDENT_AMBULATORY_CARE_PROVIDER_SITE_OTHER): Payer: Self-pay | Admitting: *Deleted

## 2009-09-18 ENCOUNTER — Telehealth (INDEPENDENT_AMBULATORY_CARE_PROVIDER_SITE_OTHER): Payer: Self-pay | Admitting: *Deleted

## 2009-09-22 ENCOUNTER — Ambulatory Visit: Payer: Self-pay | Admitting: Cardiology

## 2009-11-10 ENCOUNTER — Emergency Department (HOSPITAL_COMMUNITY): Admission: EM | Admit: 2009-11-10 | Discharge: 2009-11-10 | Payer: Self-pay | Admitting: Emergency Medicine

## 2010-10-23 ENCOUNTER — Emergency Department (HOSPITAL_COMMUNITY)
Admission: EM | Admit: 2010-10-23 | Discharge: 2010-10-24 | Payer: Self-pay | Source: Home / Self Care | Admitting: Emergency Medicine

## 2010-12-05 ENCOUNTER — Encounter: Payer: Self-pay | Admitting: General Surgery

## 2010-12-12 LAB — CONVERTED CEMR LAB
Blood in Urine, dipstick: NEGATIVE
Nitrite: NEGATIVE
Protein, U semiquant: NEGATIVE
Urobilinogen, UA: 0.2

## 2010-12-14 NOTE — Letter (Signed)
Summary: labs  labs   Imported By: Curtis Sites 04/21/2010 11:07:21  _____________________________________________________________________  External Attachment:    Type:   Image     Comment:   External Document

## 2010-12-14 NOTE — Letter (Signed)
Summary: progress notes  progress notes   Imported By: Curtis Sites 04/21/2010 11:07:55  _____________________________________________________________________  External Attachment:    Type:   Image     Comment:   External Document

## 2010-12-14 NOTE — Letter (Signed)
Summary: history and physical  history and physical   Imported By: Curtis Sites 04/21/2010 11:07:06  _____________________________________________________________________  External Attachment:    Type:   Image     Comment:   External Document

## 2010-12-14 NOTE — Letter (Signed)
Summary: misc  misc   Imported By: Curtis Sites 04/21/2010 11:07:37  _____________________________________________________________________  External Attachment:    Type:   Image     Comment:   External Document

## 2010-12-14 NOTE — Letter (Signed)
Summary: xray  xray   Imported By: Curtis Sites 04/21/2010 11:08:11  _____________________________________________________________________  External Attachment:    Type:   Image     Comment:   External Document

## 2010-12-14 NOTE — Letter (Signed)
Summary: consult  consult   Imported By: Curtis Sites 04/21/2010 11:06:11  _____________________________________________________________________  External Attachment:    Type:   Image     Comment:   External Document

## 2010-12-14 NOTE — Letter (Signed)
Summary: demographic  demographic   Imported By: Curtis Sites 04/21/2010 11:06:41  _____________________________________________________________________  External Attachment:    Type:   Image     Comment:   External Document

## 2011-01-24 LAB — URINALYSIS, ROUTINE W REFLEX MICROSCOPIC
Bilirubin Urine: NEGATIVE
Glucose, UA: NEGATIVE mg/dL
Ketones, ur: NEGATIVE mg/dL
pH: 5.5 (ref 5.0–8.0)

## 2011-01-24 LAB — HEPATIC FUNCTION PANEL
ALT: 16 U/L (ref 0–35)
Alkaline Phosphatase: 79 U/L (ref 39–117)
Bilirubin, Direct: <0.1 mg/dL (ref 0.0–0.3)
Total Bilirubin: 0.4 mg/dL (ref 0.3–1.2)

## 2011-01-24 LAB — BASIC METABOLIC PANEL
BUN: 13 mg/dL (ref 6–23)
Chloride: 101 mEq/L (ref 96–112)
Potassium: 3.8 mEq/L (ref 3.5–5.1)

## 2011-01-24 LAB — DIFFERENTIAL
Eosinophils Relative: 1 % (ref 0–5)
Lymphocytes Relative: 32 % (ref 12–46)
Lymphs Abs: 1.9 10*3/uL (ref 0.7–4.0)
Monocytes Absolute: 0.3 10*3/uL (ref 0.1–1.0)

## 2011-01-24 LAB — CBC
HCT: 38 % (ref 36.0–46.0)
MCV: 78.5 fL (ref 78.0–100.0)
RDW: 13.6 % (ref 11.5–15.5)
WBC: 5.9 10*3/uL (ref 4.0–10.5)

## 2011-04-01 NOTE — H&P (Signed)
NAME:  [name], [name]                            ACCOUNT NO.:  [id number]   MEDICAL RECORD NO.:  [id number]                   PATIENT TYPE:  EMS   LOCATION:  ED                                   FACILITY:  [facility]   PHYSICIAN:  Dirk Dress. Katrinka Blazing, M.D.                DATE OF BIRTH:  06/01/1971   DATE OF ADMISSION:  08/06/2003  DATE OF DISCHARGE:  08/06/2003                                HISTORY & PHYSICAL   REASON FOR ADMISSION:  The patient is a 40 year old female with history of  bilateral carpal tunnel syndrome who is status post left carpal tunnel  release.  Her left carpal tunnel was done over a year ago in early June of  2003.  Since she had her left side done, she has used her right hand more  and her right hand has become more symptomatic.  She is having more pain.  She has developed decreased grip strength as she did on the left side.  Peripheral nerve conduction velocity studies showed moderately severe  neurologic deficits bilaterally with the left side being more symptomatic  when it was initially done in late 2002.  Followup study has not been done.  The patient is scheduled to have right carpal tunnel release.   PAST MEDICAL HISTORY:  1. She has history of asthma.  2. Hyperthyroidism.  3. Depression with anxiety.   REVIEW OF SYSTEMS:  Positive for some dysphagia and episodic rectal  bleeding.   MEDICATIONS:  1. Amitriptyline 50 mg every evening.  2. Xanax 0.25 mg b.i.d.  3. Altace 5 mg daily.  4. Toprol XL 50 mg daily.   PAST SURGICAL HISTORY:  1. Left carpal tunnel release.  2. Total abdominal hysterectomy.   SOCIAL HISTORY:  She medically disabled.  She smokes one pack of cigarettes  per day.  She also drinks alcohol.   ALLERGIES:  1. ASPIRIN.  2. IVP DYE   PHYSICAL EXAMINATION:  VITAL SIGNS:  Blood pressure 120/84, pulse 84,  respirations 20, weight 176 pounds.  HEENT:  Unremarkable.  NECK:  Supple without JVD or bruits.  CHEST:  Clear to auscultation.  HEART:  Regular rate and rhythm without murmur or gallop.  ABDOMEN:  Mild epigastric and left lower quadrant tenderness.  EXTREMITIES:  Positive Phalen's sign right wrist.  Mild tenderness over the  scar, left wrist.  Grip strength is equal bilaterally on direct measurement.  She has more pain with grip on the right than on the left.  NEUROLOGIC:  No motor, sensory or cerebellar deficit.   IMPRESSION:  1. Right carpal tunnel compression syndrome.  2. History of gastroesophageal reflux disease with dysphagia.  3. History of rectal bleeding.   PLAN:  The patient will have carpal tunnel release and at some future date,  she will have upper and lower endoscopy.     ___________________________________________  Dirk Dress. Katrinka Blazing, M.D.   LCS/MEDQ  D:  08/11/2003  T:  08/11/2003  Job:  [id number]

## 2011-04-01 NOTE — Discharge Summary (Signed)
[name], [name]                              ACCOUNT NO.:  [id number]   MEDICAL RECORD NO.:  [id number]                  PATIENT TYPE:   LOCATION:                                       FACILITY:   PHYSICIAN:  [name], M.D.                DATE OF BIRTH:   DATE OF ADMISSION:  01/27/2004  DATE OF DISCHARGE:  01/27/2004                                 DISCHARGE SUMMARY   The patient left AMA on March 15.   DISCHARGE DIAGNOSES:  1. Toxic multinodular goiter.  2. Hypertension.  3. Chronic depression.  4. Migraine headaches.   SPECIAL PROCEDURE:  Bilateral subtotal thyroidectomy on March 15.   DISPOSITION:  The patient insisted that she be discharged immediately after  she recovered on the floor.  We discussed respiratory difficulty, bleeding,  weakness due to the low calcium, but she elected to sign out against medical  advice.  Plans were made for her to have her calcium and thyroid levels  checked the following morning.  She was stable at the time of discharge.   SUMMARY:  A 40 year old female with hyperthyroidism with developing  multinodular goiter.  She had been on suppressive therapy since 1999.  She  was given the option of radioactive iodine treatment, but she wished to  proceed with surgery.  She was thoroughly counseled of the potential  complications associated with operative treatment and chronic thyroiditis.  She wished to accept these complications including laryngeal nerve injury,  hoarseness, possible respiratory difficulty, hypocalcemia.  She received  SSKI preoperatively.  She was continued on propranolol and Tapazole in the  postoperative period.  Preoperative T4 was 15.7, T3 was 271.   Other medical problems were migraine headaches, asthma, and depressions.   MEDICATIONS:  Given in the admission note.   PHYSICAL EXAMINATION:  The exam was unremarkable except for mild tenderness  and diffuse enlargement of her thyroid gland.   COURSE IN HOSPITAL:   The patient underwent subtotal thyroidectomy on the  morning of March 15.  The procedure went very well.  She had fibrotic,  small, nodular gland with a few small nodes.  She did well in the early  postoperative period.  After she awakened she decided that she wanted to go  home.   I was able to convince her to wait a few hours.  After about 5 hours the  patient insisted that she be discharged.  I tried to avoid seeing her, but  she was insistent that she sign out against medical advice.  She was  counseled, along with her husband, that she had a risk of significant  problems if she should develop difficulty at night while away  from the hospital.  She stated that she would accept these potential  problems.  She was not discharged; she signed out AMA.  Arrangements were  made for her to have calcium drawn the  following morning.  Pathology  revealed a colloid nodular goiter with chronic lymphofollicular inflammatory  response.     ___________________________________________                                         [name], M.D.   LCS/MEDQ  D:  02/17/2004  T:  02/18/2004  Job:  [id number]   cc:   [name], M.D.  [address]  [place], [place] [address]  Fax: [phone]

## 2011-04-01 NOTE — Discharge Summary (Signed)
   [name], [name]                              ACCOUNT NO.:  [id number]   MEDICAL RECORD NO.:  [id number]                   PATIENT TYPE:   LOCATION:                                       FACILITY:   PHYSICIAN:  Dirk Dress. Katrinka Blazing, M.D.                DATE OF BIRTH:   DATE OF ADMISSION:  08/09/2002  DATE OF DISCHARGE:  08/10/2002                                 DISCHARGE SUMMARY   DISCHARGE DIAGNOSES:  1. Severe dysfunctional uterine bleeding.  2. Adenomyosis uteri.  3. Leiomyoma uteri.   SPECIAL PROCEDURE:  Total abdominal hysterectomy, September 26.   DISPOSITION:  The patient is discharged home in stable and satisfactory  condition.   DISCHARGE MEDICATIONS:  Tylox 2 q.4h. p.r.n.   SUMMARY:  A 40 year old female with a history of dysfunctional uterine  bleeding dating back to 1998.  She had multiple prolonged periods lasting 1-  2 weeks.  She was tried on oral contraceptives without improvement.  She was  tried on Provera but had excess bleeding.  She is status post tubal  ligation.  Ultrasound showed adenomyosis of the uterus.  The patient was  scheduled to have hysterectomy in June but it was cancelled because of death  in her family.  Her pain has become much worse since then.  She is now  admitted for total abdominal hysterectomy.   PAST MEDICAL HISTORY:  Past history is positive for hypothyroidism and  depression.  Medications are given in the admission note.   HOSPITAL COURSE:  The patient was admitted through day surgery and on  September 26 she underwent total abdominal hysterectomy.  Pathology showed  leiomyoma uteri, as well as adenomyosis.  The patient did very well.  She  was monitored for one day, had no complaints, and was discharged home on the  evening of the first postoperative day in satisfactory condition.                                               Dirk Dress. Katrinka Blazing, M.D.    LCS/MEDQ  D:  11/02/2002  T:  11/04/2002  Job:  [id number]

## 2011-04-01 NOTE — H&P (Signed)
NAME:  [name], [name]                            ACCOUNT NO.:  [id number]   MEDICAL RECORD NO.:  [id number]                   PATIENT TYPE:  AMB   LOCATION:  DAY                                  FACILITY:  [facility]   PHYSICIAN:  Jerolyn Shin C. Katrinka Blazing, M.D.                DATE OF BIRTH:  03/11/1971   DATE OF ADMISSION:  DATE OF DISCHARGE:                                HISTORY & PHYSICAL   HISTORY OF PRESENT ILLNESS:  [age]-year-old female with  hyperthyroidism with developing multinodular goiter.  The patient has been  on suppressive therapy since 1999.  She was given the option of radioactive  iodine treatment, but states she wishes to proceed with surgery.  She was  thoroughly counselled of the potential complications associated with  operative treatment of chronic thyroiditis.  The patient is therefore  scheduled for subtotal thyroidectomy.  She will receive SSKI preoperatively  and she will be continued on propranolol and Tapazole into the postoperative  period.  Her latest thyroid studies reveal a T4 of 15.7 with a T3 of 271.   PAST HISTORY:  In addition to her hyperparathyroidism the patient has a  history of chronic migraine headaches, asthma and chronic depression.   PAST SURGICAL HISTORY:  Surgeries include:  1. Left carpal tunnel release.  2. Total abdominal hysterectomy.   MEDICATIONS:  1. Xanax 0.5 mg b.i.d.  2. Ambien 10 mg q.h.s.  3. Propranolol 40 mg q.i.d.  4. Maxalt p.r.n. for headaches.  5. Tapazole.  6. Wellbutrin XL 150 mg daily.  7. Altace 7.5 mg daily.  8. Carafate 1 gram q.i.d.  9. Aciphex 10 mg q.i.d.  10.      Phenergan 25 mg q.4 hours p.r.n.   REVIEW OF SYSTEMS:  Review of systems is positive for dysphagia and history  of episodic rectal bleeding.   SOCIAL HISTORY:  The patient is medically disabled.  She smokes one pack of  cigarettes per day.  She drinks alcohol.   ALLERGIES:  ASPIRIN and IVP DYE.   PHYSICAL EXAMINATION:  VITAL SIGNS: On examination  blood pressure is 120/86,  pulse 80, respirations 20 and weight 167 pounds.  HEENT:  Unremarkable.  NECK:  Neck is supple.  No JVD or bruits.  There is mild tenderness and  enlargement of her thyroid.  No adenopathy.  CHEST:  Chemotherapy is clear to auscultation.  HEART:  Regular rate and rhythm without murmur, gallop or rub.  ABDOMEN:  Abdomen is soft and nontender.  No masses.  Healed lower abdominal  incision.  EXTREMITIES:  No cyanosis, clubbing or edema.  NEUROLOGIC EXAMINATION:  No focal motor, sensory or cerebellar deficits.   IMPRESSION:  1. Toxic multinodular goiter.  2. Hypertension.  3. Chronic anxiety disorder.  4. Chronic depression.  5. History of asthma.   PLAN:  Subtotal thyroidectomy.     ___________________________________________  Dirk Dress. Katrinka Blazing, M.D.   LCS/MEDQ  D:  01/26/2004  T:  01/27/2004  Job:  [id number]

## 2011-04-01 NOTE — Procedures (Signed)
   NAME:  [name], [name]                            ACCOUNT NO.:  [id number]   MEDICAL RECORD NO.:  [id number]                   PATIENT TYPE:  OUT   LOCATION:  RAD                                  FACILITY:  [facility]   PHYSICIAN:  Vida Roller, M.D.                DATE OF BIRTH:  04-19-1971   DATE OF PROCEDURE:  02/03/2003  DATE OF DISCHARGE:                                  ECHOCARDIOGRAM   TAPE NUMBER:  [id number]   TAPE COUNT:  [id number]   CLINICAL INFORMATION:  This is a 40 year old woman who is hyperthyroid with  palpitations.   TECHNICAL QUALITY:  The study is adequate.   MEASUREMENTS M-MODE:  1. The aorta is 29 mm.  2. The left atrium is 33 mm.  3. The septum is 11 mm.  4. The posterior wall is 10 mm.  5. The left ventricular diastolic dimension is 44 mm.  6. The left ventricular systolic dimension is 38 mm.   2-D AND DOPPLER IMAGING:  1. The left ventricle is normal size with normal wall thickness.  There is     evidence of global hypokinesis with an estimated ejection fraction of 35-     40%.  Diastolic function was not assessed.  2. The right ventricle systolic function appears also moderately down with     no significant wall motion abnormalities and normal sized right     ventricle.  3. The atria are both normal size.  There is no evidence of atrioseptal     defect.  4. The aortic valve is trileaflet and tricommisural with no evidence of     stenosis or regurgitation.  5. The mitral valve morphologically unremarkable with no stenosis or     regurgitation.  6. The tricuspid valve is morphologically unremarkable with no stenosis or     regurgitation.  7. The pulmonic valve was not well seen.  8. The pericardial structures appear normal.  9. There is no atheroma in the ascending aorta; and it appears normal.  10.      There are no cardiac masses seen.                                               Vida Roller, M.D.    JH/MEDQ  D:  02/03/2003  T:  02/04/2003   Job:  [id number]

## 2011-04-01 NOTE — Op Note (Signed)
   NAME:  [name], [name]                            ACCOUNT NO.:  [id number]   MEDICAL RECORD NO.:  [id number]                   PATIENT TYPE:  AMB   LOCATION:                                       FACILITY:  [facility]   PHYSICIAN:  Jerolyn Shin C. Katrinka Blazing, M.D.                DATE OF BIRTH:  Aug 17, 1971   DATE OF PROCEDURE:  08/12/2003  DATE OF DISCHARGE:  08/06/2003                                 OPERATIVE REPORT   PREOPERATIVE DIAGNOSIS:  Right carpal tunnel compression syndrome.   POSTOPERATIVE DIAGNOSIS:  Right carpal tunnel compression syndrome.   PROCEDURE:  Right carpal tunnel release.   SURGEON:  Dirk Dress. Katrinka Blazing, M.D.   DESCRIPTION OF PROCEDURE:  Under bier block, the right arm and hand were  prepped and draped in a sterile field. A standard incision was made with  sparing of the superficial cutaneous nerves. The incision was extended down  to the transverse carpal retinaculum which was exposed. A small incision was  made in the retinaculum and a grooved probe was placed beneath the  retinaculum proximally. Under direct vision, the transverse retinaculum was  then incised. The grooved probe was then pushed distally directly under the  retinaculum taking care to spare tendons and nerves. The retinaculum was  extended into the mid portion of the palm of the hand without difficulty. A  section of the retinaculum was excised at the wrist. The subcutaneous tissue  was closed with 2-0 chromic and the skin was closed with interrupted 3-0  Prolene. A bulky dressing was placed along with a dorsal splint. The patient  tolerated the procedure well. Tourniquet time was 35 minutes. She was  transferred to the post anesthesia care unit for monitoring because of high  level of somnolence.      ___________________________________________                                            Dirk Dress. Katrinka Blazing, M.D.   LCS/MEDQ  D:  08/12/2003  T:  08/12/2003  Job:  [id number]   cc:   Milus Mallick. Lodema Hong, M.D.  [address]  [place], [place] [address]  Fax: [phone]

## 2011-04-01 NOTE — Op Note (Signed)
   NAME:  [name], [name]                            ACCOUNT NO.:  [id number]   MEDICAL RECORD NO.:  [id number]                   PATIENT TYPE:  OUT   LOCATION:  RAD                                  FACILITY:  [facility]   PHYSICIAN:  Dirk Dress. Katrinka Blazing, M.D.                DATE OF BIRTH:  1971-04-24   DATE OF PROCEDURE:  DATE OF DISCHARGE:  08/07/2002                                 OPERATIVE REPORT   PREOPERATIVE DIAGNOSES:  Dysfunctional uterine bleeding, adenomyosis of the  uterus.   POSTOPERATIVE DIAGNOSES:  Dysfunctional uterine bleeding, adenomyosis of the  uterus.   PROCEDURE:  Total abdominal hysterectomy.   SURGEON:  Dirk Dress. Katrinka Blazing, M.D.   DESCRIPTION OF PROCEDURE:  Under general anesthesia, the patient's abdomen  was prepped and draped in a sterile field. A low Pfannenstiel incision was  made. Inspection of the pelvis revealed a hard nodular mildly enlarged  uterus with thickened wall. The ovaries appeared to be normal. Small bowel,  rectum and upper abdomen appeared to be normal.   The round ligaments were doubly clamped, divided and tied with ligatures of  #0 Dexon. The tuboovarian complex was doubly clamped at its junction with  the uterus. They were incised and then controlled with ligatures of #0  Dexon. This was sewn bilaterally. The uterine vessels were doubly clamped  with Kocher and Heaney clamps. They were incised and then controlled with a  Heaney stitch of #0 Dexon. This was continued down to the apex of the  vagina. The apex of the vagina was serially clamped using a curved Heaney  clamp. It was controlled with ligatures of #0 Dexon. The cuff was then over  sewn using running locking #0 Prolene. There was minimal bleeding probably  less than 50 cc. Hemostasis was felt to be adequate. The pelvis was  reperitonealized using running 3-0 Biosyn. Further irrigation was carried  out with no evidence of bleeding. The sponge, needle, instrument and blade  counts were verified  as correct x2. The incisions were closed using #0  Biosyn on the muscle and peritoneum, #0 Prolene on the fascia, 2-0  Biosyn  in the subcutaneous tissue and staples on the skin. A dressing was placed.  The patient was awakened from anesthesia and transferred to a bed and taken  to the post anesthesia care unit.                                               Dirk Dress. Katrinka Blazing, M.D.    LCS/MEDQ  D:  08/09/2002  T:  08/10/2002  Job:  [id number]

## 2011-04-01 NOTE — Discharge Summary (Signed)
NAME:  [name], [name]                            ACCOUNT NO.:  [id number]   MEDICAL RECORD NO.:  [id number]                   PATIENT TYPE:  INP   LOCATION:  A411                                 FACILITY:  [facility]   PHYSICIAN:  Langley Gauss, M.D.                DATE OF BIRTH:  1971/09/13   DATE OF ADMISSION:  12/19/2003  DATE OF DISCHARGE:  12/20/2003                                 DISCHARGE SUMMARY   PROCEDURE:  Laparotomy with bilateral salpingo-oophorectomy.   DISPOSITION:  Follow up in the office in five day's time for staple removal  from Pfannenstiel incision. JP drain is removed prior to discharge. The  patient is given copies of standardized discharge instructions.   DISCHARGE MEDICATIONS:  The patient has a prescription for Dilaudid 2 mg  p.o. q.6h. p.r.n. Also has previously written prescription for hydrocodone  and oxycodone.  She does have a Climara patch 0.1 mg per day in place which  has adhered very well.   PERTINENT LABORATORY STUDIES:  Admission hemoglobin 13.2. Liver function  tests within normal limits. Electrolytes within normal limits.  EKG in  normal sinus rhythm.   HOSPITAL COURSE:  The patient admitted on December 19, 2003, for planned  laparotomy and laparotomy with BSO performed without complications.  Postoperatively, the patient did well. She had no postoperative  complications. She requested removal of the Foley catheter the evening of  the surgery. Also later that same day, also requested removal of the IV. The  patient had excellent urine output. She had resumption of bowel function  with passage of flatus and is doing very well taking p.o. meds. The patient  will be continuing on the patches for the time being. Final pathology is  currently pending. Operative findings were discussed with the patient and  family following surgery and on the day of discharge.     ___________________________________________  Langley Gauss, M.D.   DC/MEDQ  D:  12/20/2003  T:  12/20/2003  Job:  [id number]

## 2011-04-01 NOTE — Op Note (Signed)
NAME:  [name], [name]                            ACCOUNT NO.:  [id number]   MEDICAL RECORD NO.:  [id number]                   PATIENT TYPE:  OIB   LOCATION:  2855                                 FACILITY:  [facility]   PHYSICIAN:  Vida Roller, M.D.                DATE OF BIRTH:  1971-01-31   DATE OF PROCEDURE:  03/18/2003  DATE OF DISCHARGE:  03/18/2003                                 OPERATIVE REPORT   PRIMARY CARE PHYSICIAN:  Dr. Syliva Overman in [place], [place].   PROCEDURES PERFORMED:  1. Right heart catheterization.  2. Left heart catheterization.  3. Coronary angiography.  4. Left ventriculography.   HISTORY OF PRESENT ILLNESS:  The patient is a 40 year old woman with  hyperthyroidism, likely secondary to over medication for history of Graves'  disease who presented to see me with fatigue, shortness of breath and mild  PND, found to have a moderately depressed LV function at 40% with global  hypokinesis consistent with a tachycardia induced cardiomyopathy and she was  treated with beta-blockers with some relief.  The right and left heart  catheterization was recommended to rule out significant coronary disease.  She has no cardiac risk factors.   DESCRIPTION OF PROCEDURE:  After obtaining informed consent, the patient was  brought to the cardiac catheterization lab/ in the fasting state.  She was  premedicated with 125 mg of Solu-Medrol, 50 mg of Benadryl and 2 mg of  Versed and 50 mcg of Fentanyl to obtain adequate conscious sedation and to  treat for potential contrast reaction.  She was prepped and draped in the  usual sterile manner and the right groin was anesthetized using 1% lidocaine  without epinephrine.  The right femoral vein was cannulated using the  modified Seldinger technique with an 8 French 10 cm sheath and the right  femoral artery was cannulated using the modified Seldinger technique with a  6 French 10 cm sheath.  Right heart catheterization  was performed using a  balloon tip flow directed Swan-Ganz catheter which was advanced under  fluoroscopic guidance into the pulmonary capillary wedge position.  There,  the tracings were obtained and the balloon was deflated.  Catheter was  repositioned into the main pulmonary artery and pulmonary artery pressures  were obtained.  Then simultaneous aliquots of blood were taken from the  femoral artery and the pulmonary artery to obtain a thick cardiac output.  Then a thermodilution cardiac outlet was performed.  Finally the catheter  was repositioned back into the right pulmonary artery and a pigtail catheter  was advanced under fluoroscopic guidance into the left ventricle.  Simultaneous pressures were then obtained in the pulmonary capillary wedge  position and the left ventricle to assess the mitral valve and then a left  ventriculogram was performed in the RAO view at 30 degrees at 12 mL a second  for a total of 36  mL of dye at a half second rate of rise 600 PSI.  The Theone Murdoch catheter was the moved in a retrograde manner through the right  ventricle to the right atrium to obtain tracings in those chambers and then  the pigtail catheter was prolapsed across the aortic valve under continuous  pressure monitoring.  Left heart catheterization was performed using a 6  French Judkins left #4 and a 6 French Judkins right #4.  At the conclusion  of the procedure the catheters were removed.  The patient was moved back to  the cardiology catheterization holding area where the femoral artery and  femoral venous sheaths were removed.  Hemostasis was obtained using direct  manual pressure.  At the conclusion of the hold, there is no evidence of  ecchymosis or hematoma formation.  Distal pulses were intact.  Total  fluoroscopic time was 2.9 minutes.  Total iodinized contrast used was 130  mL.   RESULTS:  Right heart pressures:  Right atrium had an A wave of 9, a V wave  of 9 and a mean of 7  mmHg.   Right ventricle was 34/5 with an end diastolic pressure of 9 mmHg.   The pulmonary artery pressure was 37/14 with a mean of 25 mmHg.   Pulmonary capillary wedge pressure of 9, A wave of 9, V wave of 9, mean of 8  mmHg.   Left ventricular pressure was 135/2 with an end diastolic pressure of 13  mmHg.   The aortic pressure was 142/77 with a mean artery pressure of 104 mmHg.   Cardiac output was measured at 8.4 liters a minute.   Selective coronary angiography:  The left main coronary artery is a normal  sized artery which is angiographically normal.   The left circumflex coronary artery is a normal sized artery with a high  first obtuse marginal, a small second obtuse marginal, and overall  angiographically normal.   The left anterior descending coronary artery is a normal sized artery which  has one large diagonal branch.  It is angiographically normal.   The right coronary artery is a normal size dominant vessel with a normal  sized posterior descending coronary artery and one large posterolateral  branch.   Left ventriculography reveals an ejection fraction of 54% with no wall  motion abnormalities and no mitral regurgitation.   ASSESSMENT:  This is a woman with hyperthyroidism and a tachycardia induced  cardiomyopathy which is resolving on appropriate thyroid medication.   RECOMMENDATIONS:  Continue her beta-blocker therapy and will follow her up  as an outpatient.                                               Vida Roller, M.D.    JH/MEDQ  D:  03/18/2003  T:  03/19/2003  Job:  [id number]   cc:   Milus Mallick. Lodema Hong, M.D.  [address]  [place], [place] [address]  Fax: [phone]

## 2011-04-01 NOTE — H&P (Signed)
NAME:  [name], DULING                            ACCOUNT NO.:  [id number]   MEDICAL RECORD NO.:  [id number]                   PATIENT TYPE:  AMB   LOCATION:  DAY                                  FACILITY:  [facility]   PHYSICIAN:  Jerolyn Shin C. Katrinka Blazing, M.D.                DATE OF BIRTH:  07/09/1971   DATE OF ADMISSION:  DATE OF DISCHARGE:                                HISTORY & PHYSICAL   HISTORY OF PRESENT ILLNESS:  This is a 40 year old female with history of  dysfunctional uterine bleeding and severe pelvic pain dating back to 1998.  The patient has multiple periods prolonged.  She has had periods lasting one  to two weeks and then immediately recurring.  She has been tried on oral  contraceptives without improvement.  She has had excess bleeding after  Provera treatment.  She is status post tubal ligation in 1998.  Ultrasound  shows significant adenomyosis of the uterus.  She is scheduled for  hysterectomy.  She was originally scheduled in June, but surgery was  cancelled because of a death in her family.  She said her pain had become  worse since that time.   PAST MEDICAL HISTORY:  She has a history of hyperthyroidism, depression with  anxiety, and carpal tunnel compression syndrome.  She is status post left  carpal tunnel release.   PAST SURGICAL HISTORY:  Surgery includes tubal ligation, carpal tunnel  release on the left side.   MEDICATIONS:  1. Wellbutrin 100 mg t.i.d.  2. Carafate 1 g q.i.d.  3. Xanax 0.25 mg b.i.d.  4. Tapazole q.d.  5. Indocin 75 mg b.i.d.   ALLERGIES:  ASPIRIN, PREDNISONE, PAXIL, IMITREX, and IVP DYE.   PHYSICAL EXAMINATION:  VITAL SIGNS:  Blood pressure 138/80, pulse 82,  respirations 20.  Weight 173 pounds.  HEENT:  Unremarkable.  NECK:  Supple without JVD or bruit.  CHEST:  Clear to auscultation, no rales, rubs, rhonchi, or wheezes.  CARDIAC:  Regular rate and rhythm without murmur, gallop, or rub.  ABDOMEN:  Mild suprapubic tenderness, otherwise  soft.  No palpable masses.  EXTREMITIES:  Well-healed left hand with decreased grip strength, positive  Phalen's and positive Tinel's sign on the right hand, otherwise  unremarkable.  PELVIC:  Tender pelvis with nodular uterus, with no adnexal masses.   IMPRESSION:  1. Severe dysfunctional uterine bleeding.  2. Hyperthyroidism.  3. Chronic headaches.  4.     Depression with anxiety.  5. History of asthma.   PLAN:  Total abdominal hysterectomy.                                               Dirk Dress. Katrinka Blazing, M.D.    LCS/MEDQ  D:  08/08/2002  T:  08/09/2002  Job:  [id number]

## 2011-04-01 NOTE — Consult Note (Signed)
[name], [name]                  ACCOUNT NO.:  [id number]   MEDICAL RECORD NO.:  [id number]          PATIENT TYPE:  AMB   LOCATION:                                FACILITY:  [facility]   PHYSICIAN:  Lionel December, M.D.    DATE OF BIRTH:  Jan 17, 1971   DATE OF CONSULTATION:  04/15/2005  DATE OF DISCHARGE:                                   CONSULTATION   CHIEF COMPLAINT:  Rectal bleeding, passing blood clots, abdominal pain.   REQUESTING PHYSICIAN:  Kirk Ruths, M.D.   HISTORY OF PRESENT ILLNESS:  [name] is a 40 year old Caucasian female who  presents today for further evaluation of hematochezia. For the past four  months, she has been passing a significant amount of blood in her stool. She  says she goes to the bathroom and passes nothing but fresh blood with lots  of clots. She does not have any rectal pain when this occurs. Sometimes, she  has left sided abdominal pain, however. She generally has three to four  bowel movements a day. She describes them as soft. When she gets a lot of  bleeding, sometimes her stools are more formed but not hard. She denies any  melena. Denies any rectal pain. She has had some nausea and vomiting with  these episodes. She also had upper abdominal discomfort which she states is  different. It is not necessarily related to meals or bowel movements. She  has typical heartburn symptoms that are well controlled on b.i.d. omeprazole  although dose unknown. Denies any weight loss. Her appetite is good.   CURRENT MEDICATIONS:  1.  Albuterol inhaler 90 mcg 2 puffs t.i.d.  2.  Carafate 1 g q.i.d.  3.  Clarinex 5 mg q.d.  4.  Altace 5 mg q.d.  5.  Klonopin 1 mg b.i.d.  6.  Toprol-XL 50 mg q.d.  7.  Singulair 10 mg q.d.  8.  Synthroid 100  mcg q.d.  9.  Celexa 20 mg 1-1/2 tablets daily.  10. Advair p.r.n.  11. Oxycodone 15 mg p.r.n.  12. Omeprazole b.i.d.   ALLERGIES:  EPIDURAL caused vomiting and facial blisters. ANESTHESIA  MEDICATIONS caused nausea  and vomiting. ASPIRIN nausea, vomiting, and hives.  PAXIL hives. IVP DYE nausea and vomiting. SULFA nausea and vomiting.  PREDNISONE hives. PENICILLIN hives and nausea and vomiting. IMITREX throat  swelling.   PAST MEDICAL HISTORY:  1.  History of Grave's disease status post thyroidectomy on supplementation      now.  2.  Hypertension.  3.  Manic depression.  4.  Migraine headaches.  5.  Asthma.  6.  IBS.  7.  Gastroesophageal reflux disease.   PAST SURGICAL HISTORY:  1.  Cesarean section.  2.  Hysterectomy.  3.  Tooth extraction.  4.  Thyroidectomy.  5.  Bilateral carpal tunnel release.  6.  Bilateral ovariectomy.  7.  Heart catheterization with no evidence of coronary artery disease per      patient.   FAMILY HISTORY:  Mother died at age 32 of lung cancer. Father is age 28 and  has heart disease. She has an aunt who had breast cancer with two  recurrences and eventually died of colon cancer.   SOCIAL HISTORY:  She is married and has two children. Her youngest son, age  27, has ADHD and bipolar disorder. She states her older son, 17, has manic  depression. She is on disability due to her manic depression. She smokes a  pack of cigarettes daily. Denies any alcohol use.   REVIEW OF SYSTEMS:  See HPI for GI and constitutional. CARDIOPULMONARY:  Denies chest pain or shortness of breath. Asthma well controlled.   PHYSICAL EXAMINATION:  VITAL SIGNS:  Weight 184, height 5 foot 4-1/2 inches.  Temperature 98.5, blood pressure 122/80, pulse 72.  GENERAL:  Pleasant, well-developed, well-nourished, Caucasian female in no  acute distress.  SKIN:  Warm and dry, no jaundice.  HEENT:  Pupils are equal, round, and reactive to light. Conjunctivae are  pink. Sclerae are nonicteric. Oropharyngeal mucosa moist and pink. No  lesions, erythema, or exudate. No lymphadenopathy or thyromegaly.  CHEST:  Lungs are clear to auscultation. Cardiac exam reveals regular rate  and rhythm. Normal S1 and  S2. No murmurs, rubs, or gallops.  ABDOMEN:  Positive bowel sounds. Soft, nondistended. She has diffuse mild  tenderness with deep palpation throughout her abdomen. No organomegaly or  masses. No rebound tenderness or guarding. No abdominal bruits or hernias.  EXTREMITIES:  No edema.   IMPRESSION:  [name] is a 40 year old Caucasian female with four-month history  of hematochezia. At times, she passes large volumes of blood with clots  associated with abdominal pain. Etiology is unclear at this time. Given that  she denies any rectal pain or significant constipation, would not suspect  anorectal fissures. Possibilities do include inflammatory bowel disease,  ischemic colitis (given long history of tobacco use). Chronic  gastroesophageal reflux disease symptoms, well controlled on b.i.d. PPI  therapy. She has never had an upper endoscopy. I discussed with her today  the risks, alternatives, and benefits of colonoscopy and EGD with regards to  risk of medication reactions of bleeding, perforation, and infection. She is  agreeable and wishes to proceed. Colonoscopy is for diagnostic purposes  while EGD is for further evaluation of upper abdominal pain and to rule out  complications of chronic gastroesophageal reflux disease.   PLAN:  1.  Colonoscopy and EGD in the near future. The patient requests heavy      sedation. I have discussed with Dr.      Karilyn Cota who recommends propofol. She will have an anesthesia preoperative      prior to her colonoscopy and EGD.  2.  Continue omeprazole b.i.d. for now.       LL/MEDQ  D:  04/15/2005  T:  04/16/2005  Job:  [id number]   cc:   Kirk Ruths, M.D.  [address]  [place]  [place] [address]  Fax: [phone]

## 2011-04-01 NOTE — Op Note (Signed)
NAME:  [name], [name]                            ACCOUNT NO.:  [id number]   MEDICAL RECORD NO.:  [id number]                   PATIENT TYPE:  INP   LOCATION:  A411                                 FACILITY:  [facility]   PHYSICIAN:  Langley Gauss, M.D.                DATE OF BIRTH:  12-22-1970   DATE OF PROCEDURE:  12/19/2003  DATE OF DISCHARGE:                                 OPERATIVE REPORT   PREOPERATIVE DIAGNOSES:  1. Bilateral pelvic pain.  2. Complex left adnexal mass.   PROCEDURE PERFORMED:  Laparotomy with BSO and lysis of adhesions.   SURGEON:  Langley Gauss, M.D.   ESTIMATED BLOOD LOSS:  200 mL.   ANESTHESIA:  General endotracheal.   DRAINS:  1. Foley catheter in the bladder.  2. J-P catheter in the subcutaneous space.   FINDINGS AT TIME OF SURGERY:  Complex adnexal mass which was noted to  incidentally rupture during removal with findings of clear follicular fluid.  The right ovary was noted to be atrophic in appearance.  There was noted to  be a significant adhesive disease within the pelvis, specifically in the  left with large bowel adherent to the left adnexal mass.  Additionally, the  left ovary was noted to be retroperitonealized.  There was an adhesion  between large colon and left anterior abdominal wall which was sharply  dissected.   FINDINGS:  The patient was taken to the operating room.  Vital signs were  stable.  She underwent uncomplicated induction of general anesthesia.  Vital  signs were stable.  Foley catheter was sterilely placed to straight  drainage, sterilely prepped and draped.  Knife used to incise the previous  Pfannenstiel incision, dissected down to the fascia plane with a knife,  cauterizing bleeders along the way.  The fascia was then incised in a  transverse curvilinear manner, sharply dissecting out the underlying rectus  muscle in the midline, the fascia lata distal grasping with straight Kocher  clamps, and the fascia was dissected off  the underlying rectus muscle in the  midline and both superiorly and inferiorly.  The rectus muscle was then  bluntly separated; peritoneal cavity was atraumatically bluntly entered.  The peritoneal incision extended superiorly and inferiorly, directly  visualizing the underlying bowel.  The adhesion between large colon and left  anterior abdominal wall was then sharply lysed in the avascular plane.  A  Balfour self-retaining retractor was then placed utilizing shallow blade.  Three moist packs were used to mobilize bowel out of the pelvis.  Bladder  blade is also placed.  Pelvic contents as described previously.  The right  ovary is noted to be atrophic in appearance, so it was grasped with a  Babcock clamp.  General retractor was then applied.  The round ligament as  well as right infundibulopelvic ligament are identified.  Curved Heaney  clamps are then used to clamp the  infundibulopelvic ligament very close to  the ovary itself, a Kelly clamp placed for control back-bleeding; suture  ligature of 0 Vicryl is placed.  Additional fibrous tissue adhering the  right ovary to the right pelvic sidewall was likewise grasped with a curved  Heaney clamp and ligated with 0 Vicryl.  Adhesion between the right round  ligament and right fallopian tube also required an additional pedicle by  placing a curved Heaney clamp and then suture ligature of 0 Vicryl.  This  resulted in complete removal of the right ovary which as stated previously,  was noted to be atrophic in appearance.  The right ureter was noted to be in  its normal anatomic position along the lateral pelvic sidewall and freed  from harm's way.  The left adnexal mass was then identified.  It was noted  to be retroperitonealized with significant colonic adhesions which were  sharply lysed.  These were taken sequentially as they presented which  allowed mobilization of the mass.  Incidental rupture was noted to occur, as  stated previously.   With the ovary now decompressed, it was grasped and  gently retracted which allows identification of the infundibulopelvic  ligament which is then clamped with a curved Heaney clamp, back-bleeding  controlled with a Kelly clamp.  Suture ligature of 0 Vicryl placed.  Additional pedicles within the fibrous tissue adherent to the pelvic  sidewall likewise ligated with 0 Vicryl.  The left round ligament is  identified.  This is ligated free of the ovary by again utilizing curved  Heaney clamp.  This allowed removal of the complete left ovary which was  passed off additionally with portion of fallopian tube.  The left ureter was  identified in its normal anatomic position, noted to be free from the  surgical site.  Copious irrigation is then performed until clear.  Packs are  removed.  Sponge, needle, and instrument counts are correct.  Balfour  retractor is removed.  Peritoneal edges are identified.  Perineum and  overlying rectus muscles are closed with a continuous running 0 Vicryl  suture.  The fascia is then closed with a continuous running #1 PDS suture.  Subcutaneous bleeders are cauterized.  J-P drain is placed in the  subcutaneous space with a separate exit wound to the left apex of the  incision.  J-P drain is sutured into place separately.  Three vertical  mattress sutures of #1 PDS suture are placed as retention-type sutures, and  the skin is completely closed utilizing skin staples.  Then 30 mL of 0.5%  bupivacaine plain injected along the incision to raise small skin wheals to  facilitate postoperative anesthesia.  The patient tolerated the procedure  very well.  She was extubated, taken to the recovery room in stable  condition.  Operative findings discussed with the patient's awaiting family.      ___________________________________________                                            Langley Gauss, M.D.  DC/MEDQ  D:  12/20/2003  T:  12/20/2003  Job:  [id number]

## 2011-04-01 NOTE — H&P (Signed)
NAME:  [name], [name]                            ACCOUNT NO.:  [id number]   MEDICAL RECORD NO.:  [id number]                   PATIENT TYPE:  OUT   LOCATION:  RAD                                  FACILITY:  [facility]   PHYSICIAN:  Langley Gauss, M.D.                DATE OF BIRTH:  [birth year]   DATE OF ADMISSION:  12/19/2003  DATE OF DISCHARGE:                                HISTORY & PHYSICAL   HISTORY OF PRESENT ILLNESS:  This is a 40 year old patient gravida 3, para  2, previous TAH, who is referred for outpatient laparoscopic surgery for  BSO.  The patient has had episodic intermittent right lower quadrant pain  times several years duration.  It most pertinently recently became a  significant problem for her early this year.  The patient did make an ER  visit on November 19, 2003, with the complaint of right lower quadrant pain.  She was seen by Dr. Colon Branch at that time.  Report was per the patient that a  CT scan revealed evidence of a ruptured ovarian cyst.  No appendicitis.  The  patient was referred for follow up outpatient care.  The patient  subsequently was seen for follow up visit by Dr. Syliva Overman on December 04, 2003, at which time she complained of her multiple chronic problems  consisting of uncontrolled hyperthyroidism, migraine headaches, chronic  anxiety and depression, hypertension, tobacco abuse, pelvic pain secondary  to ovarian cyt.  At that time, she was referred to my office for evaluation.  At that time, she was referred to my office for evaluation.   The patient was initially seen for office visit on December 11, 2003, at  which time ultrasound revealed a functional appearing right ovarian cyst  with a maximum diameter of 3.25 cm.  The patient was advised of this, and  due to the fact that she had not taken any pain medication for several days,  it was decided to be managed expectantly.  However, subsequently, the  patient has been seen on follow up visit on  December 16, 2003.  Currently,  she did not even have an office appointment on that day, rather just showed  up at the door on two occasions, requesting to be seen.  At that time,  repeat ultrasound revealed increased size and complexity of the cyst  identified.   PAST MEDICAL HISTORY:  1. She has a history of uncontrolled hyperthyroidism and thyroid ultrasound     has been scheduled and reportedly thyroid function studies have been     done.  Results of which are pending.  2. The patient has a history of migraine headaches.  She has been seen and     evaluated by Dr. Gerilyn Pilgrim for these.  3. The patient has history of chronic anxiety and depression and     intermittently takes either Klonopin or Xanax.  4. History  of hypertension.  5. Heavy cigarette smoker.  6. Past history TAH done on July 16, 2002.  7. Surgery on the right hand July 17, 2003.  8. Surgery on the left hand July 16, 2002.  9. There were 13 teeth removed October 15, 2002.  10.      Heart catheterization done May 16, 2003.   MEDICATIONS:  1. Carafate.  2. Xanax.  3. Aciphex.  4. Maxalt.  5. Ambien.  6. In addition, she currently has been taking for pain relief:     A. Hydrocodone.     B. Oxycodone.     C. Dilaudid.   ALLERGIES:  ASPIRIN, PREDNISONE, PAXIL, IMITREX, IVP DYE AND SOMETHING  CONTAINED IN EPIDURAL DRUGS.   SOCIAL HISTORY:  Married to [employer] who is employed at [employer].  The patient is listed as disability, uncertain what the etiology of this  disability is which would require drawing from [employer].  The  patient thus covered by both Medicaid and Medicare.  She appears to be a  heavy cigarette smoker, although, she admits to only one half pack per day  x20 years duration.   FAMILY HISTORY:  Pertinent for aunt with colon cancer and a mother with lung  cancer.   PHYSICAL EXAMINATION:  GENERAL APPEARANCE:  She appears much older than her  stated age.  VITAL  SIGNS:  Blood pressure 122/70, respiratory rate 16, pulse 88, weight  168 pounds, temperature 98.2.  HEENT:  Negative.  No adenopathy.  NECK:  Supple.  Thyroid is nonpalpable, though the patient states she has  been noted to have nodules.  LUNGS:  Clear.  CARDIOVASCULAR:  Regular rate and rhythm.  ABDOMEN:  Soft and nontender.  No surgical scars are identified other than  Pfannenstiel for prior TAH.  EXTREMITIES:  Normal.  PELVIC:  Normal external genitalia.  Bladder is well supported.  Cuff is,  likewise, well supported.  Uterus is surgically absent.  There is tenderness  both in the right and left adnexal regions but significant tenderness,  palpation to both of these, particularly on the right.  Transvaginal  ultrasound reveals a complex adnexal cyst which has changed in size in  intermittent times.  The maximum size of the ovaries measured at 5.69x4.42  cm is noted to be complex in appearance with hypoechogenic material  internally.  The examination of the cul-de-sac reveals moderate amount of  free pelvic fluid.  The other ovary is not visualized.   ASSESSMENT:  Patient with increasing pelvic pain, right greater than left,  although, it is noted to be bilateral in nature.  Ultrasound is dominated by  a large complex cystic adnexal mass located very near the midline.  Thus, it  is undeterminate whether this represents the left ovary or right ovary.  The  patient, however, is having pain on both sides, thus, at this point in time,  it is in her best interest to proceed with removal of the ovaries  bilaterally.  We will try to schedule this for December 19, 2003.  However,  this will be pending on medical clearance for her multiple previous medical  problems, most specifically the hyperthyroidism as well as the history of  cardiac catheterization.  I will be discussing with the patient rather we should proceed with laparoscopy with removal of the adnexa bilaterally or  proceed with  laparotomy due to the possibility of significant adhesive  disease and the size of mass, also, this will markedly reduce  the chance of  spillage.  The patient does understand that if ovaries are removed  bilaterally, this will result in surgical menopause requiring initiation of  hormonal replacement therapy postoperatively.     ___________________________________________                                         Langley Gauss, M.D.   DC/MEDQ  D:  12/17/2003  T:  12/17/2003  Job:  [id number]

## 2011-04-01 NOTE — Op Note (Signed)
NAMEIVELIZ, GARAY                              ACCOUNT NO.:  [id number]   MEDICAL RECORD NO.:  [id number]                  PATIENT TYPE:   LOCATION:                                       FACILITY:   PHYSICIAN:  Dirk Dress. Katrinka Blazing, M.D.                DATE OF BIRTH:   DATE OF PROCEDURE:  01/27/2004  DATE OF DISCHARGE:                                 OPERATIVE REPORT   PREOPERATIVE DIAGNOSIS:  Toxic multinodular goiter.   POSTOPERATIVE DIAGNOSIS:  Toxic multinodular goiter.   PROCEDURE:  Subtotal thyroidectomy.   SURGEON:  Dirk Dress. Katrinka Blazing, M.D.   DESCRIPTION:  Under general anesthesia, the patient's neck and upper chest  were prepped and draped in a sterile field.  A standard lower collar  incision was made extending from the anterior border of the  sternocleidomastoid muscles bilaterally.  The incision was extended to the  platysma.  The superior and inferior flaps were developed.  Strap muscles  were divided in the midline.  Initial dissection was carried out on the  right side.  Using blunt dissection the strap muscles were separated from  the underlying gland.  The inferior pole was mobilized.  Vascular supply was  double-clamped, divided, and controlled with ligatures of 3-0 silk.  Lateral  veins were divided and controlled with ties of 3-0 Dexon or clips.  The  superior pole was then dissected.   The superior laryngeal nerve was identified.  It was not injured.  Vascular  pedicle was dissected, clamped with 2 right-angle clamps, divided and  controlled with ligature of 2-0 silk and 2 staples.  The gland was then  further dissected. It was mobilized most superiorly and medially.  Once the  middle thyroid artery could be identified the parathyroids were dissected.  They were not dilated, but were simply identified.  __________ above the  vascular supply to the parathyroids and above the insertion of the recurrent  laryngeal into the lower portion of the larynx.  The gland was  then serially  clamped with straight mosquito clamps and divided.  This was done down to  the trachea. __________ fascia was serially dissected across the midline.  The tissue in the clamps was then sutured ligated with 4-0 Dexon or 4-0  silk.  Hemostasis was felt to be adequate.   Inspection for the recurrent laryngeal nerve revealed it to be in its normal  course and it was not injured.  The parathyroid vascular supply appeared to  be intact.  The gland was then dissected across the midline.  Similar  dissection was then carried out on the left side.  The inferior pole was  dissected in like manner and the vessels were suture ligated.  The lateral  veins were divided between clips.  The inferior pole was dissected.  The  superior laryngeal nerve was not adequately identified on the left side.  Nevertheless I was  able to make sure that it was not included in the  vascular pedicle.  The gland was then further mobilized.  The pedicle to the  parathyroids were identified and stand about 1.5 to 2 cm above this.  The  gland was serially clamped and divided and mosquito clamps.  The gland was  passed off.  Hemostasis was achieved with suture ligatures of 4-0 silk.  There was minimal bleeding.   The patient tolerated the procedure well.  JP drain was cut in half and then  bivalved.  A section was placed on each side and it was brought out through  the lower flap.  There was no bleeding.  The strap muscles were  reapproximated in the midline with 3-0 Biosyn.  The platysma was closed with  3-0 Dexon.  The skin was closed with subcuticular 4-  0 Dexon.  The patient tolerated the procedure well.  She was awakened from  anesthesia without difficulty.  She had no difficulty breathing.  She had  good sound to her voice.  There was no evidence of hoarseness.  She moved in  the bed without difficulty and was taken to the postanesthetic care unit in  satisfactory condition.       ___________________________________________                                            Dirk Dress Katrinka Blazing, M.D.   LCS/MEDQ  D:  02/17/2004  T:  02/18/2004  Job:  [id number]

## 2011-04-01 NOTE — Op Note (Signed)
NAMEDANIEL, JOHNDROW                  ACCOUNT NO.:  [id number]   MEDICAL RECORD NO.:  [id number]          PATIENT TYPE:  AMB   LOCATION:  DAY                           FACILITY:  [facility]   PHYSICIAN:  Lionel December, M.D.    DATE OF BIRTH:  08/23/1971   DATE OF PROCEDURE:  05/19/2005  DATE OF DISCHARGE:                                 OPERATIVE REPORT   PROCEDURE:  Esophagogastroduodenoscopy with esophageal dilation followed by  colonoscopy.   INDICATIONS:  Eevee is 40 year old Caucasian female with multiple medical  problems with 6 or 7-year history of GERD requiring double dose PPI, also  has intermittent solid food dysphagia. She has a chronic hematochezia. She  passes moderate large amount of blood and clots. Occasionally all she passes  is blood. She also complains of left-sided abdominal pain as well as upper  abdominal pain and sporadic nausea and vomiting. She is undergoing  diagnostic evaluation. Procedures were reviewed the patient, informed  consent was obtained.   PREMEDICATION:  Please see anesthesia record for detail.   FINDINGS:  Procedure performed in the OR. After the patient was placed under  anesthesia and intubated, she was positioned in left lateral position for  the procedures.   Esophagogastroduodenoscopy. Olympus videoscope was passed by oropharynx  without any difficulty into esophagus.   Esophagus. Mucosa of the esophagus normal throughout. GE junction was at 39  cm from the incisors and no ring, stricture, or hernia was noted.   Stomach. It was empty and distended very well with insufflation. Folds of  proximal stomach were normal. Examination mucosa at body, antrum, pyloric  channel as well as angularis, fundus and cardia was normal.   Duodenum.  Bulbar mucosa was normal. Scope was passed second part of  duodenum. The mucosa and folds were normal. Endoscope was withdrawn.   Esophagus was dilated by passing 54 and 56-French dilators to full  insertion.  Endoscope was passed again and esophagus reexamined. There was no  mucosal injury noted. The patient was then prepared for procedure #2.   Colonoscopy. Rectal examination performed. No abnormality noted external or  digital exam. Olympus videoscope was placed rectum and advanced under vision  into sigmoid colon beyond. Preparation was satisfactory. She had few pieces  of formed stool in the sigmoid colon. Scope was easily passed cecum which  was identified by ileocecal valve and appendiceal orifice. Pictures taken  for the record. Short segment of T I was also examined and was normal. As  the scope was withdrawn, colonic mucosa was carefully examined. There was a  small polyp at midsigmoid colon which was ablated via cold biopsy. There  were few tiny hyperplastic appearing polyps at the rectum which were left  alone. Scope was retroflexed to examine anorectal junction.  There was  single large hemorrhoid with focal erythema below the dentate line and an  anal papilla. Pictures taken for the record. Endoscope was then withdrawn.  The patient tolerated the procedures well. She is in the process being  extubated.   FINAL DIAGNOSIS:  Normal esophagogastroduodenoscopy. No evidence of  complicated  GERD or Barrett's.   Esophagus dilated by passing 54 and 56-French Maloney dilators given history  of dysphagia.   Normal T I.  Single small polyp ablated by cold biopsy from the sigmoid  colon. External hemorrhoids and anal papilla. External hemorrhoids felt to  be source of the patient's chronic hematochezia.   RECOMMENDATIONS:  She will continue antireflux measures and omeprazole 20  milligrams once or twice daily.   High-fiber diet.   Anusol-HC suppository one per rectum at bedtime for two weeks.   I will be contacting the patient with biopsy results.       NR/MEDQ  D:  05/19/2005  T:  05/19/2005  Job:  [id number]   cc:   Kirk Ruths, M.D.  [address]  [place]  [place]  [address]  Fax: [phone]

## 2011-04-01 NOTE — Op Note (Signed)
   NAME:  [name], [name]                            ACCOUNT NO.:  [id number]   MEDICAL RECORD NO.:  [id number]                   PATIENT TYPE:  MB   LOCATION:                                       FACILITY:  [facility]   PHYSICIAN:  Jerolyn Shin C. Katrinka Blazing, M.D.                DATE OF BIRTH:  09-18-1971   DATE OF PROCEDURE:  04/19/2002  DATE OF DISCHARGE:  04/19/2002                                 OPERATIVE REPORT   PREOPERATIVE DIAGNOSES:  Left carpal tunnel syndrome.   POSTOPERATIVE DIAGNOSES:  Left carpal tunnel syndrome.   PROCEDURE:  Left carpal tunnel release.   SURGEON:  Dr. Elpidio Anis.   DESCRIPTION OF PROCEDURE:  Under Bier block, the left hand and arm were  prepped and draped in a sterile field. A standard incision was made over the  wrist and into the palm of the hand taking care to avoid the palmar branch  of the superficial ulnar cutaneous nerve. The incision was extended down to  the volar carpal ligament. A small incision was made in the volar carpal  ligament and the handle of the knife blade was then placed between the  ligament and the underlying tendons and nerves. Once this was done, the  volar carpal ligament was sequentially incised throughout its length in the  distal wrist and in the palm of the hand. A portion of the ligament after  this was excised. The subcutaneous tissue and skin were closed with 3-0  Dexon and 3-0 Prolene respectively. The tourniquet was released and a  dressing was placed.  The patient tolerated the procedure well. She was  transferred to a wheelchair and taken to day surgery.                                               Dirk Dress. Katrinka Blazing, M.D.    LCS/MEDQ  D:  06/23/2002  T:  06/26/2002  Job:  [id number]

## 2011-08-04 LAB — URINALYSIS, ROUTINE W REFLEX MICROSCOPIC
Bilirubin Urine: NEGATIVE
Hgb urine dipstick: NEGATIVE
Protein, ur: NEGATIVE
Urobilinogen, UA: 0.2

## 2011-11-14 ENCOUNTER — Encounter: Payer: Self-pay | Admitting: Cardiology

## 2012-12-20 ENCOUNTER — Emergency Department (HOSPITAL_COMMUNITY): Payer: PRIVATE HEALTH INSURANCE

## 2012-12-20 ENCOUNTER — Encounter (HOSPITAL_COMMUNITY): Payer: Self-pay | Admitting: *Deleted

## 2012-12-20 ENCOUNTER — Emergency Department (HOSPITAL_COMMUNITY)
Admission: EM | Admit: 2012-12-20 | Discharge: 2012-12-20 | Disposition: A | Discharge status: Home / Self Care | Payer: PRIVATE HEALTH INSURANCE | Attending: Emergency Medicine | Admitting: Emergency Medicine

## 2012-12-20 DIAGNOSIS — R220 Localized swelling, mass and lump, head: Secondary | ICD-10-CM | POA: Insufficient documentation

## 2012-12-20 DIAGNOSIS — Z8719 Personal history of other diseases of the digestive system: Secondary | ICD-10-CM | POA: Insufficient documentation

## 2012-12-20 DIAGNOSIS — R634 Abnormal weight loss: Secondary | ICD-10-CM | POA: Insufficient documentation

## 2012-12-20 DIAGNOSIS — L03211 Cellulitis of face: Secondary | ICD-10-CM | POA: Insufficient documentation

## 2012-12-20 DIAGNOSIS — R05 Cough: Secondary | ICD-10-CM | POA: Insufficient documentation

## 2012-12-20 DIAGNOSIS — Z8669 Personal history of other diseases of the nervous system and sense organs: Secondary | ICD-10-CM | POA: Insufficient documentation

## 2012-12-20 DIAGNOSIS — Z8659 Personal history of other mental and behavioral disorders: Secondary | ICD-10-CM | POA: Insufficient documentation

## 2012-12-20 DIAGNOSIS — Z862 Personal history of diseases of the blood and blood-forming organs and certain disorders involving the immune mechanism: Secondary | ICD-10-CM | POA: Insufficient documentation

## 2012-12-20 DIAGNOSIS — Z79899 Other long term (current) drug therapy: Secondary | ICD-10-CM | POA: Insufficient documentation

## 2012-12-20 DIAGNOSIS — Z8781 Personal history of (healed) traumatic fracture: Secondary | ICD-10-CM | POA: Insufficient documentation

## 2012-12-20 DIAGNOSIS — R197 Diarrhea, unspecified: Secondary | ICD-10-CM | POA: Insufficient documentation

## 2012-12-20 DIAGNOSIS — J449 Chronic obstructive pulmonary disease, unspecified: Secondary | ICD-10-CM | POA: Insufficient documentation

## 2012-12-20 DIAGNOSIS — Z8639 Personal history of other endocrine, nutritional and metabolic disease: Secondary | ICD-10-CM | POA: Insufficient documentation

## 2012-12-20 DIAGNOSIS — L0201 Cutaneous abscess of face: Secondary | ICD-10-CM | POA: Insufficient documentation

## 2012-12-20 DIAGNOSIS — Z8679 Personal history of other diseases of the circulatory system: Secondary | ICD-10-CM | POA: Insufficient documentation

## 2012-12-20 DIAGNOSIS — R1110 Vomiting, unspecified: Secondary | ICD-10-CM | POA: Insufficient documentation

## 2012-12-20 DIAGNOSIS — R059 Cough, unspecified: Secondary | ICD-10-CM | POA: Insufficient documentation

## 2012-12-20 DIAGNOSIS — F17200 Nicotine dependence, unspecified, uncomplicated: Secondary | ICD-10-CM | POA: Insufficient documentation

## 2012-12-20 DIAGNOSIS — Z8619 Personal history of other infectious and parasitic diseases: Secondary | ICD-10-CM | POA: Insufficient documentation

## 2012-12-20 DIAGNOSIS — H9209 Otalgia, unspecified ear: Secondary | ICD-10-CM | POA: Insufficient documentation

## 2012-12-20 DIAGNOSIS — IMO0002 Reserved for concepts with insufficient information to code with codable children: Secondary | ICD-10-CM | POA: Insufficient documentation

## 2012-12-20 DIAGNOSIS — J4489 Other specified chronic obstructive pulmonary disease: Secondary | ICD-10-CM | POA: Insufficient documentation

## 2012-12-20 DIAGNOSIS — Z8739 Personal history of other diseases of the musculoskeletal system and connective tissue: Secondary | ICD-10-CM | POA: Insufficient documentation

## 2012-12-20 DIAGNOSIS — R509 Fever, unspecified: Secondary | ICD-10-CM | POA: Insufficient documentation

## 2012-12-20 LAB — CBC WITH DIFFERENTIAL/PLATELET
Basophils Absolute: 0 10*3/uL (ref 0.0–0.1)
Eosinophils Relative: 1 % (ref 0–5)
HCT: 41.7 % (ref 36.0–46.0)
Hemoglobin: 13.8 g/dL (ref 12.0–15.0)
Lymphocytes Relative: 17 % (ref 12–46)
MCHC: 33.1 g/dL (ref 30.0–36.0)
MCV: 82.9 fL (ref 78.0–100.0)
Monocytes Absolute: 0.3 10*3/uL (ref 0.1–1.0)
Monocytes Relative: 5 % (ref 3–12)
Neutro Abs: 4.5 10*3/uL (ref 1.7–7.7)
RDW: 13.2 % (ref 11.5–15.5)
WBC: 5.8 10*3/uL (ref 4.0–10.5)

## 2012-12-20 LAB — COMPREHENSIVE METABOLIC PANEL
AST: 24 U/L (ref 0–37)
BUN: 7 mg/dL (ref 6–23)
CO2: 30 mEq/L (ref 19–32)
Calcium: 9.2 mg/dL (ref 8.4–10.5)
Chloride: 97 mEq/L (ref 96–112)
Creatinine, Ser: 0.7 mg/dL (ref 0.50–1.10)
GFR calc Af Amer: >90 mL/min (ref >90)
GFR calc non Af Amer: >90 mL/min (ref >90)
Glucose, Bld: 109 mg/dL — ABNORMAL HIGH (ref 70–99)
Total Bilirubin: 0.3 mg/dL (ref 0.3–1.2)

## 2012-12-20 LAB — URINALYSIS, ROUTINE W REFLEX MICROSCOPIC
Hgb urine dipstick: NEGATIVE
Leukocytes, UA: NEGATIVE
Nitrite: NEGATIVE
Protein, ur: NEGATIVE mg/dL
Specific Gravity, Urine: 1.02 (ref 1.005–1.030)
Urobilinogen, UA: 0.2 mg/dL (ref 0.0–1.0)

## 2012-12-20 MED ORDER — CLINDAMYCIN HCL 150 MG PO CAPS
300.0000 mg | ORAL_CAPSULE | Freq: Once | ORAL | Status: AC
  Administered 2012-12-20: 300 mg via ORAL
  Filled 2012-12-20: qty 2

## 2012-12-20 MED ORDER — SODIUM CHLORIDE 0.9 % IV SOLN
Freq: Once | INTRAVENOUS | Status: AC
  Administered 2012-12-20: 13:00:00 via INTRAVENOUS

## 2012-12-20 MED ORDER — CLINDAMYCIN HCL 300 MG PO CAPS: 300.0000 mg | ORAL_CAPSULE | Freq: Four times a day (QID) | ORAL | Status: DC

## 2012-12-20 MED ORDER — HYDROCODONE-ACETAMINOPHEN 5-325 MG PO TABS
1.0000 | ORAL_TABLET | Freq: Once | ORAL | Status: AC
  Administered 2012-12-20: 1 via ORAL
  Filled 2012-12-20: qty 1

## 2012-12-20 MED ORDER — HYDROCODONE-ACETAMINOPHEN 5-325 MG PO TABS: 1.0000 | ORAL_TABLET | ORAL | Status: AC | PRN

## 2012-12-20 NOTE — ED Notes (Signed)
Swelling  Lt side of face, being treated for infection . Seen at [facility] today and told to come here,  Arrived by EMS.
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
## 2013-06-12 ENCOUNTER — Other Ambulatory Visit (HOSPITAL_COMMUNITY): Payer: Self-pay | Admitting: General Practice

## 2013-06-12 DIAGNOSIS — Z139 Encounter for screening, unspecified: Secondary | ICD-10-CM

## 2013-06-17 ENCOUNTER — Ambulatory Visit (HOSPITAL_COMMUNITY): Payer: PRIVATE HEALTH INSURANCE

## 2013-06-20 ENCOUNTER — Ambulatory Visit (HOSPITAL_COMMUNITY): Payer: PRIVATE HEALTH INSURANCE

## 2013-06-24 ENCOUNTER — Ambulatory Visit (HOSPITAL_COMMUNITY)
Admission: RE | Admit: 2013-06-24 | Discharge: 2013-06-24 | Disposition: A | Discharge status: Home / Self Care | Payer: PRIVATE HEALTH INSURANCE | Source: Ambulatory Visit | Attending: General Practice | Admitting: General Practice

## 2013-06-24 DIAGNOSIS — Z139 Encounter for screening, unspecified: Secondary | ICD-10-CM

## 2013-06-24 DIAGNOSIS — Z1231 Encounter for screening mammogram for malignant neoplasm of breast: Secondary | ICD-10-CM | POA: Insufficient documentation

## 2013-06-25 ENCOUNTER — Other Ambulatory Visit: Payer: Self-pay | Admitting: General Practice

## 2013-06-25 DIAGNOSIS — R928 Other abnormal and inconclusive findings on diagnostic imaging of breast: Secondary | ICD-10-CM

## 2013-07-10 ENCOUNTER — Emergency Department (HOSPITAL_COMMUNITY): Payer: PRIVATE HEALTH INSURANCE

## 2013-07-10 ENCOUNTER — Encounter (HOSPITAL_COMMUNITY): Payer: Self-pay | Admitting: Emergency Medicine

## 2013-07-10 ENCOUNTER — Emergency Department (HOSPITAL_COMMUNITY)
Admission: EM | Admit: 2013-07-10 | Discharge: 2013-07-10 | Disposition: A | Discharge status: Home / Self Care | Payer: PRIVATE HEALTH INSURANCE | Attending: Emergency Medicine | Admitting: Emergency Medicine

## 2013-07-10 ENCOUNTER — Ambulatory Visit (HOSPITAL_COMMUNITY)
Admission: RE | Admit: 2013-07-10 | Discharge: 2013-07-10 | Disposition: A | Discharge status: Home / Self Care | Payer: PRIVATE HEALTH INSURANCE | Source: Ambulatory Visit | Attending: General Practice | Admitting: General Practice

## 2013-07-10 DIAGNOSIS — R928 Other abnormal and inconclusive findings on diagnostic imaging of breast: Secondary | ICD-10-CM | POA: Insufficient documentation

## 2013-07-10 DIAGNOSIS — Z862 Personal history of diseases of the blood and blood-forming organs and certain disorders involving the immune mechanism: Secondary | ICD-10-CM | POA: Insufficient documentation

## 2013-07-10 DIAGNOSIS — J4489 Other specified chronic obstructive pulmonary disease: Secondary | ICD-10-CM | POA: Insufficient documentation

## 2013-07-10 DIAGNOSIS — Y9289 Other specified places as the place of occurrence of the external cause: Secondary | ICD-10-CM | POA: Insufficient documentation

## 2013-07-10 DIAGNOSIS — J449 Chronic obstructive pulmonary disease, unspecified: Secondary | ICD-10-CM | POA: Insufficient documentation

## 2013-07-10 DIAGNOSIS — Z8709 Personal history of other diseases of the respiratory system: Secondary | ICD-10-CM | POA: Insufficient documentation

## 2013-07-10 DIAGNOSIS — F17200 Nicotine dependence, unspecified, uncomplicated: Secondary | ICD-10-CM | POA: Insufficient documentation

## 2013-07-10 DIAGNOSIS — S93402A Sprain of unspecified ligament of left ankle, initial encounter: Secondary | ICD-10-CM

## 2013-07-10 DIAGNOSIS — Z79899 Other long term (current) drug therapy: Secondary | ICD-10-CM | POA: Insufficient documentation

## 2013-07-10 DIAGNOSIS — Z8659 Personal history of other mental and behavioral disorders: Secondary | ICD-10-CM | POA: Insufficient documentation

## 2013-07-10 DIAGNOSIS — Z8781 Personal history of (healed) traumatic fracture: Secondary | ICD-10-CM | POA: Insufficient documentation

## 2013-07-10 DIAGNOSIS — S93409A Sprain of unspecified ligament of unspecified ankle, initial encounter: Secondary | ICD-10-CM | POA: Insufficient documentation

## 2013-07-10 DIAGNOSIS — Z8679 Personal history of other diseases of the circulatory system: Secondary | ICD-10-CM | POA: Insufficient documentation

## 2013-07-10 DIAGNOSIS — Z8739 Personal history of other diseases of the musculoskeletal system and connective tissue: Secondary | ICD-10-CM | POA: Insufficient documentation

## 2013-07-10 DIAGNOSIS — Z880 Allergy status to penicillin: Secondary | ICD-10-CM | POA: Insufficient documentation

## 2013-07-10 DIAGNOSIS — W108XXA Fall (on) (from) other stairs and steps, initial encounter: Secondary | ICD-10-CM | POA: Insufficient documentation

## 2013-07-10 DIAGNOSIS — Y9301 Activity, walking, marching and hiking: Secondary | ICD-10-CM | POA: Insufficient documentation

## 2013-07-10 DIAGNOSIS — Z8639 Personal history of other endocrine, nutritional and metabolic disease: Secondary | ICD-10-CM | POA: Insufficient documentation

## 2013-07-10 DIAGNOSIS — Z8719 Personal history of other diseases of the digestive system: Secondary | ICD-10-CM | POA: Insufficient documentation

## 2013-07-10 MED ORDER — HYDROCODONE-ACETAMINOPHEN 5-325 MG PO TABS: 1.0000 | ORAL_TABLET | ORAL | Status: DC | PRN
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
## 2013-07-10 NOTE — ED Notes (Signed)
Pt reports left ankle pain x 1 week. Pt reports rolling left ankle 5 days ago. No deformity noted.

## 2013-07-10 NOTE — ED Notes (Signed)
Pt reports walking down steps and "rolled" her left ankle.  Some swelling noted to outer ankle.  Pedal pulse present, color WNL, cap refill WNL.

## 2013-07-12 NOTE — ED Provider Notes (Signed)
Medical screening examination/treatment/procedure(s) were performed by non-physician practitioner and as supervising physician I was immediately available for consultation/collaboration.  Donnetta Hutching, MD 07/12/13 [phone]

## 2013-07-24 ENCOUNTER — Telehealth: Payer: Self-pay | Admitting: Family Medicine

## 2013-07-26 NOTE — Telephone Encounter (Signed)
Called patient and no answer and no answering machine.

## 2013-07-26 NOTE — Telephone Encounter (Signed)
pls let her know no  Available slots at this time, but may check back next year

## 2013-07-31 NOTE — Telephone Encounter (Signed)
Patient is aware 

## 2013-10-14 ENCOUNTER — Emergency Department (HOSPITAL_COMMUNITY)
Admission: EM | Admit: 2013-10-14 | Discharge: 2013-10-14 | Disposition: A | Discharge status: Home / Self Care | Payer: PRIVATE HEALTH INSURANCE | Attending: Emergency Medicine | Admitting: Emergency Medicine

## 2013-10-14 ENCOUNTER — Encounter (HOSPITAL_COMMUNITY): Payer: Self-pay | Admitting: Emergency Medicine

## 2013-10-14 ENCOUNTER — Emergency Department (HOSPITAL_COMMUNITY): Payer: PRIVATE HEALTH INSURANCE

## 2013-10-14 DIAGNOSIS — M545 Low back pain, unspecified: Secondary | ICD-10-CM | POA: Insufficient documentation

## 2013-10-14 DIAGNOSIS — J449 Chronic obstructive pulmonary disease, unspecified: Secondary | ICD-10-CM | POA: Insufficient documentation

## 2013-10-14 DIAGNOSIS — Z79899 Other long term (current) drug therapy: Secondary | ICD-10-CM | POA: Insufficient documentation

## 2013-10-14 DIAGNOSIS — Z8781 Personal history of (healed) traumatic fracture: Secondary | ICD-10-CM | POA: Insufficient documentation

## 2013-10-14 DIAGNOSIS — M169 Osteoarthritis of hip, unspecified: Secondary | ICD-10-CM | POA: Insufficient documentation

## 2013-10-14 DIAGNOSIS — E039 Hypothyroidism, unspecified: Secondary | ICD-10-CM | POA: Insufficient documentation

## 2013-10-14 DIAGNOSIS — M1610 Unilateral primary osteoarthritis, unspecified hip: Secondary | ICD-10-CM | POA: Insufficient documentation

## 2013-10-14 DIAGNOSIS — Z8659 Personal history of other mental and behavioral disorders: Secondary | ICD-10-CM | POA: Insufficient documentation

## 2013-10-14 DIAGNOSIS — Z8679 Personal history of other diseases of the circulatory system: Secondary | ICD-10-CM | POA: Insufficient documentation

## 2013-10-14 DIAGNOSIS — F17200 Nicotine dependence, unspecified, uncomplicated: Secondary | ICD-10-CM | POA: Insufficient documentation

## 2013-10-14 DIAGNOSIS — Z8719 Personal history of other diseases of the digestive system: Secondary | ICD-10-CM | POA: Insufficient documentation

## 2013-10-14 DIAGNOSIS — Z880 Allergy status to penicillin: Secondary | ICD-10-CM | POA: Insufficient documentation

## 2013-10-14 DIAGNOSIS — J4489 Other specified chronic obstructive pulmonary disease: Secondary | ICD-10-CM | POA: Insufficient documentation

## 2013-10-14 DIAGNOSIS — IMO0002 Reserved for concepts with insufficient information to code with codable children: Secondary | ICD-10-CM | POA: Insufficient documentation

## 2013-10-14 DIAGNOSIS — M069 Rheumatoid arthritis, unspecified: Secondary | ICD-10-CM | POA: Insufficient documentation

## 2013-10-14 DIAGNOSIS — M25561 Pain in right knee: Secondary | ICD-10-CM

## 2013-10-14 DIAGNOSIS — M25551 Pain in right hip: Secondary | ICD-10-CM

## 2013-10-14 DIAGNOSIS — M1990 Unspecified osteoarthritis, unspecified site: Secondary | ICD-10-CM | POA: Insufficient documentation

## 2013-10-14 DIAGNOSIS — M25569 Pain in unspecified knee: Secondary | ICD-10-CM | POA: Insufficient documentation

## 2013-10-14 MED ORDER — TRAMADOL HCL 50 MG PO TABS: 50.0000 mg | ORAL_TABLET | Freq: Four times a day (QID) | ORAL | Status: DC | PRN

## 2013-10-14 MED ORDER — HYDROCODONE-ACETAMINOPHEN 5-325 MG PO TABS
2.0000 | ORAL_TABLET | Freq: Once | ORAL | Status: AC
  Administered 2013-10-14: 2 via ORAL
  Filled 2013-10-14: qty 2
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
## 2013-10-14 NOTE — ED Notes (Signed)
Pt possibly twisted right knee 2 days ago. Denies obvious injury. Nad. No swelling noted. otc med with no relief.

## 2013-10-14 NOTE — ED Notes (Signed)
When pt was given her Rx for tramadol, she said she could not take it that it made her N/V.  This was not on her allergy list.  Says she forgot it.  Informed the PA, and she said she could not order a narcotic.  When I told the pt this , she says she is going to try the tramadol

## 2013-10-14 NOTE — ED Notes (Signed)
Pain rt knee , with pain up into rt hip and and into lower leg. Hx of RA,

## 2013-10-15 NOTE — ED Provider Notes (Signed)
Medical screening examination/treatment/procedure(s) were performed by non-physician practitioner and as supervising physician I was immediately available for consultation/collaboration.  EKG Interpretation   None         Tamarah Bhullar L Aianna Fahs, MD 10/15/13 1322 

## 2014-02-21 DIAGNOSIS — M069 Rheumatoid arthritis, unspecified: Secondary | ICD-10-CM | POA: Insufficient documentation

## 2014-05-26 ENCOUNTER — Encounter: Payer: Self-pay | Admitting: Neurology

## 2014-05-26 ENCOUNTER — Ambulatory Visit (INDEPENDENT_AMBULATORY_CARE_PROVIDER_SITE_OTHER): Payer: PRIVATE HEALTH INSURANCE | Admitting: Neurology

## 2014-05-26 VITALS — BP 127/83 | HR 70 | Ht 64.7 in | Wt 215.0 lb

## 2014-05-26 DIAGNOSIS — G43909 Migraine, unspecified, not intractable, without status migrainosus: Secondary | ICD-10-CM

## 2014-05-26 DIAGNOSIS — R42 Dizziness and giddiness: Secondary | ICD-10-CM

## 2014-05-26 MED ORDER — PROPRANOLOL HCL 10 MG PO TABS: ORAL_TABLET | ORAL | Status: DC
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
## 2014-05-26 NOTE — Patient Instructions (Addendum)
On propronolol, watch out for feelings of fatigue or depression. Let me know if the dizziness increases on the medication.    Dizziness Dizziness is a common problem. It is a feeling of unsteadiness or light-headedness. You may feel like you are about to faint. Dizziness can lead to injury if you stumble or fall. A person of any age group can suffer from dizziness, but dizziness is more common in older adults. CAUSES  Dizziness can be caused by many different things, including:  Middle ear problems.  Standing for too long.  Infections.  An allergic reaction.  Aging.  An emotional response to something, such as the sight of blood.  Side effects of medicines.  Tiredness.  Problems with circulation or blood pressure.  Excessive use of alcohol or medicines, or illegal drug use.  Breathing too fast (hyperventilation).  An irregular heart rhythm (arrhythmia).  A low red blood cell count (anemia).  Pregnancy.  Vomiting, diarrhea, fever, or other illnesses that cause body fluid loss (dehydration).  Diseases or conditions such as Parkinson's disease, high blood pressure (hypertension), diabetes, and thyroid problems.  Exposure to extreme heat. DIAGNOSIS  Your health care provider will ask about your symptoms, perform a physical exam, and perform an electrocardiogram (ECG) to record the electrical activity of your heart. Your health care provider may also perform other heart or blood tests to determine the cause of your dizziness. These may include:  Transthoracic echocardiogram (TTE). During echocardiography, sound waves are used to evaluate how blood flows through your heart.  Transesophageal echocardiogram (TEE).  Cardiac monitoring. This allows your health care provider to monitor your heart rate and rhythm in real time.  Holter monitor. This is a portable device that records your heartbeat and can help diagnose heart arrhythmias. It allows your health care provider  to track your heart activity for several days if needed.  Stress tests by exercise or by giving medicine that makes the heart beat faster. TREATMENT  Treatment of dizziness depends on the cause of your symptoms and can vary greatly. HOME CARE INSTRUCTIONS   Drink enough fluids to keep your urine clear or pale yellow. This is especially important in very hot weather. In older adults, it is also important in cold weather.  Take your medicine exactly as directed if your dizziness is caused by medicines. When taking blood pressure medicines, it is especially important to get up slowly.  Rise slowly from chairs and steady yourself until you feel okay.  In the morning, first sit up on the side of the bed. When you feel okay, stand slowly while holding onto something until you know your balance is fine.  Move your legs often if you need to stand in one place for a long time. Tighten and relax your muscles in your legs while standing.  Have someone stay with you for 1-2 days if dizziness continues to be a problem. Do this until you feel you are well enough to stay alone. Have the person call your health care provider if he or she notices changes in you that are concerning.  Do not drive or use heavy machinery if you feel dizzy.  Do not drink alcohol. SEEK IMMEDIATE MEDICAL CARE IF:   Your dizziness or light-headedness gets worse.  You feel nauseous or vomit.  You have problems talking, walking, or using your arms, hands, or legs.  You feel weak.  You are not thinking clearly or you have trouble forming sentences. It may take a friend or  family member to notice this.  You have chest pain, abdominal pain, shortness of breath, or sweating.  Your vision changes.  You notice any bleeding.  You have side effects from medicine that seems to be getting worse rather than better. MAKE SURE YOU:   Understand these instructions.  Will watch your condition.  Will get help right away if you  are not doing well or get worse. Document Released: 04/26/2001 Document Revised: 11/05/2013 Document Reviewed: 05/20/2011 [facility] Patient Information 2015 [place], [place]. This information is not intended to replace advice given to you by your health care provider. Make sure you discuss any questions you have with your health care provider.

## 2014-06-02 ENCOUNTER — Ambulatory Visit (HOSPITAL_COMMUNITY): Payer: PRIVATE HEALTH INSURANCE

## 2014-06-06 ENCOUNTER — Ambulatory Visit (HOSPITAL_COMMUNITY)
Admission: RE | Admit: 2014-06-06 | Discharge: 2014-06-06 | Disposition: A | Discharge status: Home / Self Care | Payer: PRIVATE HEALTH INSURANCE | Source: Ambulatory Visit | Attending: Neurology | Admitting: Neurology

## 2014-06-06 ENCOUNTER — Telehealth: Payer: Self-pay | Admitting: Neurology

## 2014-06-06 DIAGNOSIS — R42 Dizziness and giddiness: Secondary | ICD-10-CM | POA: Insufficient documentation

## 2014-06-06 DIAGNOSIS — R55 Syncope and collapse: Secondary | ICD-10-CM | POA: Insufficient documentation

## 2014-06-06 DIAGNOSIS — G43909 Migraine, unspecified, not intractable, without status migrainosus: Secondary | ICD-10-CM

## 2014-06-06 DIAGNOSIS — I10 Essential (primary) hypertension: Secondary | ICD-10-CM | POA: Insufficient documentation

## 2014-06-06 DIAGNOSIS — G930 Cerebral cysts: Secondary | ICD-10-CM | POA: Diagnosis not present

## 2014-06-06 NOTE — Telephone Encounter (Signed)
I called patient. MRI of the brain is unremarkable. Pineal cyst is stable.   MRI brain July 20 13,015:  IMPRESSION: No significant abnormality. 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

## 2014-06-13 ENCOUNTER — Telehealth: Payer: Self-pay | Admitting: Neurology

## 2014-06-13 NOTE — Telephone Encounter (Signed)
Pt calling for Carotid Doppler results.  Please call anytime and if not available ok to leave detailed message on vm.  Thanks

## 2014-06-16 NOTE — Telephone Encounter (Signed)
Patient aware of Carotid Doppler results.

## 2014-09-25 ENCOUNTER — Ambulatory Visit: Payer: PRIVATE HEALTH INSURANCE | Admitting: Adult Health

## 2016-04-06 ENCOUNTER — Other Ambulatory Visit (HOSPITAL_COMMUNITY): Payer: Self-pay | Admitting: Nurse Practitioner

## 2016-04-06 DIAGNOSIS — Z1231 Encounter for screening mammogram for malignant neoplasm of breast: Secondary | ICD-10-CM

## 2016-04-13 ENCOUNTER — Ambulatory Visit (HOSPITAL_COMMUNITY): Payer: Medicaid Other

## 2016-08-08 ENCOUNTER — Other Ambulatory Visit (HOSPITAL_COMMUNITY): Payer: Self-pay | Admitting: Nurse Practitioner

## 2016-08-08 ENCOUNTER — Other Ambulatory Visit: Payer: Self-pay | Admitting: Nurse Practitioner

## 2016-08-08 DIAGNOSIS — M4722 Other spondylosis with radiculopathy, cervical region: Secondary | ICD-10-CM

## 2016-09-02 ENCOUNTER — Ambulatory Visit (HOSPITAL_COMMUNITY): Payer: Medicare Other | Attending: Nurse Practitioner

## 2017-09-25 ENCOUNTER — Encounter (HOSPITAL_COMMUNITY): Payer: Self-pay | Admitting: Emergency Medicine

## 2017-09-25 ENCOUNTER — Emergency Department (HOSPITAL_COMMUNITY)
Admission: EM | Admit: 2017-09-25 | Discharge: 2017-09-25 | Disposition: A | Discharge status: Home / Self Care | Payer: Medicare Other | Attending: Emergency Medicine | Admitting: Emergency Medicine

## 2017-09-25 DIAGNOSIS — N764 Abscess of vulva: Secondary | ICD-10-CM | POA: Insufficient documentation

## 2017-09-25 DIAGNOSIS — Z79899 Other long term (current) drug therapy: Secondary | ICD-10-CM | POA: Insufficient documentation

## 2017-09-25 DIAGNOSIS — F17210 Nicotine dependence, cigarettes, uncomplicated: Secondary | ICD-10-CM | POA: Diagnosis not present

## 2017-09-25 DIAGNOSIS — J449 Chronic obstructive pulmonary disease, unspecified: Secondary | ICD-10-CM | POA: Insufficient documentation

## 2017-09-25 DIAGNOSIS — E039 Hypothyroidism, unspecified: Secondary | ICD-10-CM | POA: Diagnosis not present

## 2017-09-25 DIAGNOSIS — I10 Essential (primary) hypertension: Secondary | ICD-10-CM | POA: Insufficient documentation

## 2017-09-25 DIAGNOSIS — J45909 Unspecified asthma, uncomplicated: Secondary | ICD-10-CM | POA: Diagnosis not present

## 2017-09-25 DIAGNOSIS — N9089 Other specified noninflammatory disorders of vulva and perineum: Secondary | ICD-10-CM | POA: Diagnosis present

## 2017-09-25 MED ORDER — DOXYCYCLINE HYCLATE 100 MG PO CAPS: 100.0000 mg | ORAL_CAPSULE | Freq: Two times a day (BID) | ORAL | 0 refills | Status: DC

## 2017-09-25 NOTE — ED Triage Notes (Signed)
Pt c/o vaginal abscess x 4 days.

## 2017-09-25 NOTE — Discharge Instructions (Signed)
Please soak in a warm tub of Epson salt water for about 10-15 minutes daily.  Please use doxycycline 2 times daily with food.  Please call and schedule an appointment with Dr. Emelda Fear concerning your abscess.
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
## 2017-10-20 ENCOUNTER — Ambulatory Visit: Payer: Medicare Other | Admitting: Urology

## 2017-12-11 ENCOUNTER — Ambulatory Visit: Payer: Medicare Other | Attending: Nurse Practitioner | Admitting: Nurse Practitioner

## 2018-01-11 ENCOUNTER — Ambulatory Visit: Payer: Medicare Other | Attending: Nurse Practitioner | Admitting: Nurse Practitioner

## 2018-01-11 ENCOUNTER — Other Ambulatory Visit: Payer: Self-pay

## 2018-01-11 ENCOUNTER — Encounter: Payer: Self-pay | Admitting: Nurse Practitioner

## 2018-01-11 VITALS — BP 144/92 | HR 80 | Temp 98.2°F | Resp 16 | Ht 63.0 in | Wt 208.0 lb

## 2018-01-11 DIAGNOSIS — Z90722 Acquired absence of ovaries, bilateral: Secondary | ICD-10-CM | POA: Insufficient documentation

## 2018-01-11 DIAGNOSIS — Z7951 Long term (current) use of inhaled steroids: Secondary | ICD-10-CM | POA: Insufficient documentation

## 2018-01-11 DIAGNOSIS — M25511 Pain in right shoulder: Secondary | ICD-10-CM | POA: Diagnosis not present

## 2018-01-11 DIAGNOSIS — M899 Disorder of bone, unspecified: Secondary | ICD-10-CM

## 2018-01-11 DIAGNOSIS — M25571 Pain in right ankle and joints of right foot: Secondary | ICD-10-CM | POA: Diagnosis not present

## 2018-01-11 DIAGNOSIS — Z886 Allergy status to analgesic agent status: Secondary | ICD-10-CM | POA: Insufficient documentation

## 2018-01-11 DIAGNOSIS — E890 Postprocedural hypothyroidism: Secondary | ICD-10-CM | POA: Insufficient documentation

## 2018-01-11 DIAGNOSIS — E782 Mixed hyperlipidemia: Secondary | ICD-10-CM | POA: Insufficient documentation

## 2018-01-11 DIAGNOSIS — M25562 Pain in left knee: Secondary | ICD-10-CM

## 2018-01-11 DIAGNOSIS — Z79890 Hormone replacement therapy (postmenopausal): Secondary | ICD-10-CM | POA: Diagnosis not present

## 2018-01-11 DIAGNOSIS — M1990 Unspecified osteoarthritis, unspecified site: Secondary | ICD-10-CM | POA: Diagnosis not present

## 2018-01-11 DIAGNOSIS — Z8711 Personal history of peptic ulcer disease: Secondary | ICD-10-CM | POA: Diagnosis not present

## 2018-01-11 DIAGNOSIS — Z79899 Other long term (current) drug therapy: Secondary | ICD-10-CM | POA: Diagnosis not present

## 2018-01-11 DIAGNOSIS — Z882 Allergy status to sulfonamides status: Secondary | ICD-10-CM | POA: Insufficient documentation

## 2018-01-11 DIAGNOSIS — K589 Irritable bowel syndrome without diarrhea: Secondary | ICD-10-CM | POA: Insufficient documentation

## 2018-01-11 DIAGNOSIS — M2550 Pain in unspecified joint: Secondary | ICD-10-CM | POA: Diagnosis present

## 2018-01-11 DIAGNOSIS — E0500 Thyrotoxicosis with diffuse goiter without thyrotoxic crisis or storm: Secondary | ICD-10-CM | POA: Insufficient documentation

## 2018-01-11 DIAGNOSIS — I10 Essential (primary) hypertension: Secondary | ICD-10-CM | POA: Diagnosis not present

## 2018-01-11 DIAGNOSIS — K219 Gastro-esophageal reflux disease without esophagitis: Secondary | ICD-10-CM | POA: Diagnosis not present

## 2018-01-11 DIAGNOSIS — M069 Rheumatoid arthritis, unspecified: Secondary | ICD-10-CM | POA: Diagnosis not present

## 2018-01-11 DIAGNOSIS — M25552 Pain in left hip: Secondary | ICD-10-CM | POA: Diagnosis not present

## 2018-01-11 DIAGNOSIS — Z90710 Acquired absence of both cervix and uterus: Secondary | ICD-10-CM | POA: Insufficient documentation

## 2018-01-11 DIAGNOSIS — M25512 Pain in left shoulder: Secondary | ICD-10-CM | POA: Diagnosis not present

## 2018-01-11 DIAGNOSIS — K279 Peptic ulcer, site unspecified, unspecified as acute or chronic, without hemorrhage or perforation: Secondary | ICD-10-CM | POA: Insufficient documentation

## 2018-01-11 DIAGNOSIS — M25572 Pain in left ankle and joints of left foot: Secondary | ICD-10-CM | POA: Diagnosis not present

## 2018-01-11 DIAGNOSIS — Z789 Other specified health status: Secondary | ICD-10-CM | POA: Diagnosis not present

## 2018-01-11 DIAGNOSIS — M25551 Pain in right hip: Secondary | ICD-10-CM | POA: Diagnosis not present

## 2018-01-11 DIAGNOSIS — J449 Chronic obstructive pulmonary disease, unspecified: Secondary | ICD-10-CM | POA: Diagnosis not present

## 2018-01-11 DIAGNOSIS — M549 Dorsalgia, unspecified: Secondary | ICD-10-CM | POA: Diagnosis not present

## 2018-01-11 DIAGNOSIS — F17210 Nicotine dependence, cigarettes, uncomplicated: Secondary | ICD-10-CM | POA: Insufficient documentation

## 2018-01-11 DIAGNOSIS — M545 Low back pain, unspecified: Secondary | ICD-10-CM | POA: Insufficient documentation

## 2018-01-11 DIAGNOSIS — Z79891 Long term (current) use of opiate analgesic: Secondary | ICD-10-CM

## 2018-01-11 DIAGNOSIS — Z7984 Long term (current) use of oral hypoglycemic drugs: Secondary | ICD-10-CM | POA: Diagnosis not present

## 2018-01-11 DIAGNOSIS — Z801 Family history of malignant neoplasm of trachea, bronchus and lung: Secondary | ICD-10-CM | POA: Insufficient documentation

## 2018-01-11 DIAGNOSIS — F319 Bipolar disorder, unspecified: Secondary | ICD-10-CM | POA: Insufficient documentation

## 2018-01-11 DIAGNOSIS — M797 Fibromyalgia: Secondary | ICD-10-CM | POA: Diagnosis not present

## 2018-01-11 DIAGNOSIS — G894 Chronic pain syndrome: Secondary | ICD-10-CM

## 2018-01-11 DIAGNOSIS — G8929 Other chronic pain: Secondary | ICD-10-CM | POA: Diagnosis not present

## 2018-01-11 DIAGNOSIS — M25561 Pain in right knee: Secondary | ICD-10-CM | POA: Diagnosis not present

## 2018-01-11 DIAGNOSIS — Z87442 Personal history of urinary calculi: Secondary | ICD-10-CM | POA: Insufficient documentation

## 2018-01-11 DIAGNOSIS — Z800 Family history of malignant neoplasm of digestive organs: Secondary | ICD-10-CM | POA: Insufficient documentation

## 2018-01-11 DIAGNOSIS — Z888 Allergy status to other drugs, medicaments and biological substances status: Secondary | ICD-10-CM | POA: Insufficient documentation

## 2018-01-11 DIAGNOSIS — Z803 Family history of malignant neoplasm of breast: Secondary | ICD-10-CM | POA: Insufficient documentation

## 2018-01-11 DIAGNOSIS — Z880 Allergy status to penicillin: Secondary | ICD-10-CM | POA: Insufficient documentation

## 2018-01-11 NOTE — Progress Notes (Signed)
Safety precautions to be maintained throughout the outpatient stay will include: orient to surroundings, keep bed in low position, maintain call bell within reach at all times, provide assistance with transfer out of bed and ambulation.  
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
## 2018-01-11 NOTE — Patient Instructions (Addendum)
_______________________________________________________________________________Pt understands need to schedule med/psych evaluation and have xrays completed, as ordered, prior to scheduling next appt. with pain clinic._____________  Appointment Policy Summary  It is our goal and responsibility to provide the medical community with assistance in the evaluation and management of patients with chronic pain. Unfortunately our resources are limited. Because we do not have an unlimited amount of time, or available appointments, we are required to closely monitor and manage their use. The following rules exist to maximize their use:  Patient's responsibilities: 1. Punctuality:  At what time should I arrive? You should be physically present in our office 30 minutes before your scheduled appointment. Your scheduled appointment is with your assigned healthcare provider. However, it takes 5-10 minutes to be "checked-in", and another 15 minutes for the nurses to do the admission. If you arrive to our office at the time you were given for your appointment, you will end up being at least 20-25 minutes late to your appointment with the provider. 2. Tardiness:  What happens if I arrive only a few minutes after my scheduled appointment time? You will need to reschedule your appointment. The cutoff is your appointment time. This is why it is so important that you arrive at least 30 minutes before that appointment. If you have an appointment scheduled for 10:00 AM and you arrive at 10:01, you will be required to reschedule your appointment.  3. Plan ahead:  Always assume that you will encounter traffic on your way in. Plan for it. If you are dependent on a driver, make sure they understand these rules and the need to arrive early. 4. Other appointments and responsibilities:  Avoid scheduling any other appointments before or after your pain clinic appointments.  5. Be prepared:  Write down everything that you need to  discuss with your healthcare provider and give this information to the admitting nurse. Write down the medications that you will need refilled. Bring your pills and bottles (even the empty ones), to all of your appointments, except for those where a procedure is scheduled. 6. No children or pets:  Find someone to take care of them. It is not appropriate to bring them in. 7. Scheduling changes:  We request "advanced notification" of any changes or cancellations. 8. Advanced notification:  Defined as a time period of more than 24 hours prior to the originally scheduled appointment. This allows for the appointment to be offered to other patients. 9. Rescheduling:  When a visit is rescheduled, it will require the cancellation of the original appointment. For this reason they both fall within the category of "Cancellations".  10. Cancellations:  They require advanced notification. Any cancellation less than 24 hours before the  appointment will be recorded as a "No Show". 11. No Show:  Defined as an unkept appointment where the patient failed to notify or declare to the practice their intention or inability to keep the appointment.  Corrective process for repeat offenders:  1. Tardiness: Three (3) episodes of rescheduling due to late arrivals will be recorded as one (1) "No Show". 2. Cancellation or reschedule: Three (3) cancellations or rescheduling will be recorded as one (1) "No Show". 3. "No Shows": Three (3) "No Shows" within a 12 month period will result in discharge from the practice. ____________________________________________________________________________________________  ____________________________________________________________________________________________  Pain Scale  Introduction: The pain score used by this practice is the Verbal Numerical Rating Scale (VNRS-11). This is an 11-point scale. It is for adults and children 10 years or older. There are significant  differences in  how the pain score is reported, used, and applied. Forget everything you learned in the past and learn this scoring system.  General Information: The scale should reflect your current level of pain. Unless you are specifically asked for the level of your worst pain, or your average pain. If you are asked for one of these two, then it should be understood that it is over the past 24 hours.  Basic Activities of Daily Living (ADL): Personal hygiene, dressing, eating, transferring, and using restroom.  Instructions: Most patients tend to report their level of pain as a combination of two factors, their physical pain and their psychosocial pain. This last one is also known as "suffering" and it is reflection of how physical pain affects you socially and psychologically. From now on, report them separately. From this point on, when asked to report your pain level, report only your physical pain. Use the following table for reference.  Pain Clinic Pain Levels (0-5/10)  Pain Level Score  Description  No Pain 0   Mild pain 1 Nagging, annoying, but does not interfere with basic activities of daily living (ADL). Patients are able to eat, bathe, get dressed, toileting (being able to get on and off the toilet and perform personal hygiene functions), transfer (move in and out of bed or a chair without assistance), and maintain continence (able to control bladder and bowel functions). Blood pressure and heart rate are unaffected. A normal heart rate for a healthy adult ranges from 60 to 100 bpm (beats per minute).   Mild to moderate pain 2 Noticeable and distracting. Impossible to hide from other people. More frequent flare-ups. Still possible to adapt and function close to normal. It can be very annoying and may have occasional stronger flare-ups. With discipline, patients may get used to it and adapt.   Moderate pain 3 Interferes significantly with activities of daily living (ADL). It becomes difficult to feed,  bathe, get dressed, get on and off the toilet or to perform personal hygiene functions. Difficult to get in and out of bed or a chair without assistance. Very distracting. With effort, it can be ignored when deeply involved in activities.   Moderately severe pain 4 Impossible to ignore for more than a few minutes. With effort, patients may still be able to manage work or participate in some social activities. Very difficult to concentrate. Signs of autonomic nervous system discharge are evident: dilated pupils (mydriasis); mild sweating (diaphoresis); sleep interference. Heart rate becomes elevated (>115 bpm). Diastolic blood pressure (lower number) rises above 100 mmHg. Patients find relief in laying down and not moving.   Severe pain 5 Intense and extremely unpleasant. Associated with frowning face and frequent crying. Pain overwhelms the senses.  Ability to do any activity or maintain social relationships becomes significantly limited. Conversation becomes difficult. Pacing back and forth is common, as getting into a comfortable position is nearly impossible. Pain wakes you up from deep sleep. Physical signs will be obvious: pupillary dilation; increased sweating; goosebumps; brisk reflexes; cold, clammy hands and feet; nausea, vomiting or dry heaves; loss of appetite; significant sleep disturbance with inability to fall asleep or to remain asleep. When persistent, significant weight loss is observed due to the complete loss of appetite and sleep deprivation.  Blood pressure and heart rate becomes significantly elevated. Caution: If elevated blood pressure triggers a pounding headache associated with blurred vision, then the patient should immediately seek attention at an urgent or emergency care unit, as these may  be signs of an impending stroke.    Emergency Department Pain Levels (6-10/10)  Emergency Room Pain 6 Severely limiting. Requires emergency care and should not be seen or managed at an  outpatient pain management facility. Communication becomes difficult and requires great effort. Assistance to reach the emergency department may be required. Facial flushing and profuse sweating along with potentially dangerous increases in heart rate and blood pressure will be evident.   Distressing pain 7 Self-care is very difficult. Assistance is required to transport, or use restroom. Assistance to reach the emergency department will be required. Tasks requiring coordination, such as bathing and getting dressed become very difficult.   Disabling pain 8 Self-care is no longer possible. At this level, pain is disabling. The individual is unable to do even the most "basic" activities such as walking, eating, bathing, dressing, transferring to a bed, or toileting. Fine motor skills are lost. It is difficult to think clearly.   Incapacitating pain 9 Pain becomes incapacitating. Thought processing is no longer possible. Difficult to remember your own name. Control of movement and coordination are lost.   The worst pain imaginable 10 At this level, most patients pass out from pain. When this level is reached, collapse of the autonomic nervous system occurs, leading to a sudden drop in blood pressure and heart rate. This in turn results in a temporary and dramatic drop in blood flow to the brain, leading to a loss of consciousness. Fainting is one of the body's self defense mechanisms. Passing out puts the brain in a calmed state and causes it to shut down for a while, in order to begin the healing process.    Summary: 1. Refer to this scale when providing [place] with your pain level. 2. Be accurate and careful when reporting your pain level. This will help with your care. 3. Over-reporting your pain level will lead to loss of credibility. 4. Even a level of 1/10 means that there is pain and will be treated at our facility. 5. High, inaccurate reporting will be documented as "Symptom Exaggeration", leading  to loss of credibility and suspicions of possible secondary gains such as obtaining more narcotics, or wanting to appear disabled, for fraudulent reasons. 6. Only pain levels of 5 or below will be seen at our facility. 7. Pain levels of 6 and above will be sent to the Emergency Department and the appointment cancelled. ____________________________________________________________________________________________   BMI Assessment: Estimated body mass index is 36.85 kg/m as calculated from the following:   Height as of this encounter: 5\' 3"  (1.6 m).   Weight as of this encounter: 208 lb (94.3 kg).  BMI interpretation table: BMI level Category Range association with higher incidence of chronic pain  <18 kg/m2 Underweight   18.5-24.9 kg/m2 Ideal body weight   25-29.9 kg/m2 Overweight Increased incidence by 20%  30-34.9 kg/m2 Obese (Class I) Increased incidence by 68%  35-39.9 kg/m2 Severe obesity (Class II) Increased incidence by 136%  >40 kg/m2 Extreme obesity (Class III) Increased incidence by 254%   BMI Readings from Last 4 Encounters:  01/11/18 36.85 kg/m  09/25/17 34.16 kg/m  05/26/14 36.11 kg/m  07/10/13 34.84 kg/m   Wt Readings from Last 4 Encounters:  01/11/18 208 lb (94.3 kg)  09/25/17 199 lb (90.3 kg)  05/26/14 215 lb (97.5 kg)  07/10/13 203 lb (92.1 kg)

## 2018-01-15 LAB — COMPLIANCE DRUG ANALYSIS, UR

## 2018-02-14 ENCOUNTER — Other Ambulatory Visit (HOSPITAL_COMMUNITY): Payer: Self-pay | Admitting: Internal Medicine

## 2018-02-14 DIAGNOSIS — M542 Cervicalgia: Secondary | ICD-10-CM

## 2018-02-19 ENCOUNTER — Other Ambulatory Visit (HOSPITAL_COMMUNITY): Payer: Self-pay | Admitting: Family Medicine

## 2018-02-19 DIAGNOSIS — Z1231 Encounter for screening mammogram for malignant neoplasm of breast: Secondary | ICD-10-CM

## 2018-02-20 ENCOUNTER — Ambulatory Visit (HOSPITAL_COMMUNITY): Payer: Medicare Other

## 2018-02-20 ENCOUNTER — Encounter: Payer: Self-pay | Admitting: Internal Medicine

## 2018-03-09 ENCOUNTER — Ambulatory Visit (HOSPITAL_COMMUNITY)
Admission: RE | Admit: 2018-03-09 | Discharge: 2018-03-09 | Disposition: A | Discharge status: Home / Self Care | Payer: Medicare Other | Source: Ambulatory Visit | Attending: Family Medicine | Admitting: Family Medicine

## 2018-03-09 ENCOUNTER — Encounter (HOSPITAL_COMMUNITY): Payer: Self-pay

## 2018-03-09 ENCOUNTER — Ambulatory Visit (HOSPITAL_COMMUNITY)
Admission: RE | Admit: 2018-03-09 | Discharge: 2018-03-09 | Disposition: A | Discharge status: Home / Self Care | Payer: Medicare Other | Source: Ambulatory Visit | Attending: Internal Medicine | Admitting: Internal Medicine

## 2018-03-09 DIAGNOSIS — Z1231 Encounter for screening mammogram for malignant neoplasm of breast: Secondary | ICD-10-CM

## 2018-03-09 DIAGNOSIS — M50223 Other cervical disc displacement at C6-C7 level: Secondary | ICD-10-CM | POA: Insufficient documentation

## 2018-03-09 DIAGNOSIS — M47812 Spondylosis without myelopathy or radiculopathy, cervical region: Secondary | ICD-10-CM | POA: Insufficient documentation

## 2018-03-09 DIAGNOSIS — M542 Cervicalgia: Secondary | ICD-10-CM | POA: Diagnosis present

## 2018-03-09 MED ORDER — GADOBENATE DIMEGLUMINE 529 MG/ML IV SOLN
20.0000 mL | Freq: Once | INTRAVENOUS | Status: AC | PRN
  Administered 2018-03-09: 20 mL via INTRAVENOUS

## 2018-03-31 ENCOUNTER — Encounter (HOSPITAL_COMMUNITY): Payer: Self-pay | Admitting: Emergency Medicine

## 2018-03-31 ENCOUNTER — Other Ambulatory Visit: Payer: Self-pay

## 2018-03-31 ENCOUNTER — Emergency Department (HOSPITAL_COMMUNITY)
Admission: EM | Admit: 2018-03-31 | Discharge: 2018-03-31 | Disposition: A | Discharge status: Home / Self Care | Payer: Medicare Other | Attending: Emergency Medicine | Admitting: Emergency Medicine

## 2018-03-31 DIAGNOSIS — Z7984 Long term (current) use of oral hypoglycemic drugs: Secondary | ICD-10-CM | POA: Insufficient documentation

## 2018-03-31 DIAGNOSIS — J449 Chronic obstructive pulmonary disease, unspecified: Secondary | ICD-10-CM | POA: Insufficient documentation

## 2018-03-31 DIAGNOSIS — W57XXXA Bitten or stung by nonvenomous insect and other nonvenomous arthropods, initial encounter: Secondary | ICD-10-CM | POA: Diagnosis not present

## 2018-03-31 DIAGNOSIS — F17210 Nicotine dependence, cigarettes, uncomplicated: Secondary | ICD-10-CM | POA: Diagnosis not present

## 2018-03-31 DIAGNOSIS — M79674 Pain in right toe(s): Secondary | ICD-10-CM | POA: Insufficient documentation

## 2018-03-31 DIAGNOSIS — M79673 Pain in unspecified foot: Secondary | ICD-10-CM | POA: Diagnosis present

## 2018-03-31 DIAGNOSIS — Z79899 Other long term (current) drug therapy: Secondary | ICD-10-CM | POA: Diagnosis not present

## 2018-03-31 DIAGNOSIS — M79645 Pain in left finger(s): Secondary | ICD-10-CM | POA: Insufficient documentation

## 2018-03-31 DIAGNOSIS — E039 Hypothyroidism, unspecified: Secondary | ICD-10-CM | POA: Insufficient documentation

## 2018-03-31 DIAGNOSIS — M2550 Pain in unspecified joint: Secondary | ICD-10-CM

## 2018-03-31 LAB — CBC WITH DIFFERENTIAL/PLATELET
Basophils Absolute: 0 10*3/uL (ref 0.0–0.1)
Basophils Relative: 0 %
Eosinophils Absolute: 0 10*3/uL (ref 0.0–0.7)
Eosinophils Relative: 0 %
HEMATOCRIT: 40.4 % (ref 36.0–46.0)
HEMOGLOBIN: 13 g/dL (ref 12.0–15.0)
LYMPHS PCT: 15 %
Lymphs Abs: 1.1 10*3/uL (ref 0.7–4.0)
MCH: 26.6 pg (ref 26.0–34.0)
MCHC: 32.2 g/dL (ref 30.0–36.0)
MCV: 82.8 fL (ref 78.0–100.0)
MONOS PCT: 6 %
Monocytes Absolute: 0.4 10*3/uL (ref 0.1–1.0)
NEUTROS ABS: 5.7 10*3/uL (ref 1.7–7.7)
NEUTROS PCT: 79 %
Platelets: 182 10*3/uL (ref 150–400)
RBC: 4.88 MIL/uL (ref 3.87–5.11)
RDW: 13.6 % (ref 11.5–15.5)
WBC: 7.3 10*3/uL (ref 4.0–10.5)

## 2018-03-31 LAB — BASIC METABOLIC PANEL
ANION GAP: 9 (ref 5–15)
BUN: 13 mg/dL (ref 6–20)
CO2: 27 mmol/L (ref 22–32)
Calcium: 9.1 mg/dL (ref 8.9–10.3)
Chloride: 100 mmol/L — ABNORMAL LOW (ref 101–111)
Creatinine, Ser: 0.7 mg/dL (ref 0.44–1.00)
GFR calc non Af Amer: >60 mL/min (ref >60)
Glucose, Bld: 112 mg/dL — ABNORMAL HIGH (ref 65–99)
Potassium: 3.7 mmol/L (ref 3.5–5.1)
Sodium: 136 mmol/L (ref 135–145)

## 2018-03-31 LAB — SEDIMENTATION RATE: Sed Rate: 30 mm/hr — ABNORMAL HIGH (ref 0–22)

## 2018-03-31 MED ORDER — ONDANSETRON HCL 4 MG/2ML IJ SOLN
4.0000 mg | Freq: Once | INTRAMUSCULAR | Status: AC
  Administered 2018-03-31: 4 mg via INTRAVENOUS

## 2018-03-31 MED ORDER — ONDANSETRON HCL 4 MG/2ML IJ SOLN
INTRAMUSCULAR | Status: AC
  Filled 2018-03-31: qty 2

## 2018-03-31 MED ORDER — OXYCODONE-ACETAMINOPHEN 5-325 MG PO TABS: 1.0000 | ORAL_TABLET | ORAL | 0 refills | Status: AC | PRN

## 2018-03-31 MED ORDER — ONDANSETRON HCL 4 MG PO TABS: 4.0000 mg | ORAL_TABLET | Freq: Four times a day (QID) | ORAL | 0 refills | Status: AC

## 2018-03-31 MED ORDER — DOXYCYCLINE HYCLATE 100 MG PO CAPS: 100.0000 mg | ORAL_CAPSULE | Freq: Two times a day (BID) | ORAL | 0 refills | Status: DC

## 2018-03-31 MED ORDER — HYDROMORPHONE HCL 1 MG/ML IJ SOLN
1.0000 mg | Freq: Once | INTRAMUSCULAR | Status: AC
  Administered 2018-03-31: 1 mg via INTRAVENOUS
  Filled 2018-03-31: qty 1

## 2018-03-31 NOTE — Discharge Instructions (Addendum)
Follow-up with your primary doctor next week for recheck.  Return here for any worsening symptoms such as persistent fever, rash or increasing pain

## 2018-03-31 NOTE — ED Triage Notes (Signed)
Onset 3 days ago, big toe and left and hand, last night processed to bilateral foot pain and swelling. Left third knuckle swollen and painful. Pt states she crawled into car, assisted in to wheelchair when arrived here

## 2018-03-31 NOTE — ED Notes (Signed)
Pt stated that she had removed a tick few days ago.
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
## 2018-05-07 ENCOUNTER — Ambulatory Visit: Payer: Medicare Other | Admitting: Gastroenterology

## 2018-08-03 ENCOUNTER — Ambulatory Visit: Payer: Medicare Other | Admitting: Gastroenterology

## 2019-04-23 ENCOUNTER — Other Ambulatory Visit (HOSPITAL_COMMUNITY): Payer: Self-pay | Admitting: Family Medicine

## 2019-04-23 ENCOUNTER — Other Ambulatory Visit (HOSPITAL_COMMUNITY): Payer: Self-pay | Admitting: Emergency Medicine

## 2019-04-23 DIAGNOSIS — Z1231 Encounter for screening mammogram for malignant neoplasm of breast: Secondary | ICD-10-CM

## 2020-12-22 ENCOUNTER — Other Ambulatory Visit (HOSPITAL_COMMUNITY): Payer: Self-pay | Admitting: Nurse Practitioner

## 2020-12-22 ENCOUNTER — Other Ambulatory Visit (HOSPITAL_COMMUNITY): Payer: Self-pay | Admitting: Pain Medicine

## 2020-12-22 ENCOUNTER — Other Ambulatory Visit (HOSPITAL_COMMUNITY): Payer: Self-pay | Admitting: Anesthesiology

## 2020-12-22 DIAGNOSIS — M47812 Spondylosis without myelopathy or radiculopathy, cervical region: Secondary | ICD-10-CM

## 2020-12-22 DIAGNOSIS — Z9289 Personal history of other medical treatment: Secondary | ICD-10-CM

## 2020-12-22 DIAGNOSIS — M47816 Spondylosis without myelopathy or radiculopathy, lumbar region: Secondary | ICD-10-CM

## 2021-01-08 ENCOUNTER — Other Ambulatory Visit: Payer: Self-pay

## 2021-01-08 ENCOUNTER — Other Ambulatory Visit (HOSPITAL_COMMUNITY): Payer: Self-pay | Admitting: Nurse Practitioner

## 2021-01-08 ENCOUNTER — Ambulatory Visit (HOSPITAL_COMMUNITY)
Admission: RE | Admit: 2021-01-08 | Discharge: 2021-01-08 | Disposition: A | Discharge status: Home / Self Care | Payer: Medicare Other | Source: Ambulatory Visit | Attending: Nurse Practitioner | Admitting: Nurse Practitioner

## 2021-01-08 DIAGNOSIS — M47812 Spondylosis without myelopathy or radiculopathy, cervical region: Secondary | ICD-10-CM | POA: Diagnosis present

## 2021-01-08 DIAGNOSIS — Z9289 Personal history of other medical treatment: Secondary | ICD-10-CM | POA: Insufficient documentation

## 2021-01-08 DIAGNOSIS — M47816 Spondylosis without myelopathy or radiculopathy, lumbar region: Secondary | ICD-10-CM | POA: Diagnosis present

## 2022-01-10 ENCOUNTER — Encounter: Payer: Self-pay | Admitting: Gastroenterology
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
## 2022-04-21 ENCOUNTER — Ambulatory Visit: Payer: Medicaid Other | Admitting: Gastroenterology

## 2022-06-28 DIAGNOSIS — M1612 Unilateral primary osteoarthritis, left hip: Secondary | ICD-10-CM | POA: Insufficient documentation

## 2022-06-28 DIAGNOSIS — F1120 Opioid dependence, uncomplicated: Secondary | ICD-10-CM | POA: Insufficient documentation

## 2022-06-28 DIAGNOSIS — M25511 Pain in right shoulder: Secondary | ICD-10-CM | POA: Insufficient documentation

## 2023-03-02 ENCOUNTER — Other Ambulatory Visit (HOSPITAL_COMMUNITY): Payer: Self-pay | Admitting: Podiatry

## 2023-03-02 DIAGNOSIS — R229 Localized swelling, mass and lump, unspecified: Secondary | ICD-10-CM

## 2023-03-02 DIAGNOSIS — M79672 Pain in left foot: Secondary | ICD-10-CM

## 2023-03-02 DIAGNOSIS — M79671 Pain in right foot: Secondary | ICD-10-CM

## 2023-03-15 ENCOUNTER — Ambulatory Visit: Payer: Medicaid Other | Admitting: Internal Medicine

## 2023-03-16 ENCOUNTER — Ambulatory Visit: Payer: 59 | Attending: Internal Medicine | Admitting: Internal Medicine

## 2023-03-16 ENCOUNTER — Encounter: Payer: Self-pay | Admitting: Internal Medicine

## 2023-03-16 ENCOUNTER — Ambulatory Visit (INDEPENDENT_AMBULATORY_CARE_PROVIDER_SITE_OTHER): Payer: 59

## 2023-03-16 VITALS — BP 126/78 | HR 82 | Ht 63.5 in | Wt 184.0 lb

## 2023-03-16 DIAGNOSIS — R002 Palpitations: Secondary | ICD-10-CM | POA: Diagnosis not present

## 2023-03-16 DIAGNOSIS — Z8249 Family history of ischemic heart disease and other diseases of the circulatory system: Secondary | ICD-10-CM | POA: Diagnosis not present

## 2023-03-16 DIAGNOSIS — Z720 Tobacco use: Secondary | ICD-10-CM | POA: Diagnosis not present

## 2023-03-16 NOTE — Patient Instructions (Signed)
Medication Instructions:  Your physician recommends that you continue on your current medications as directed. Please refer to the Current Medication list given to you today.  *If you need a refill on your cardiac medications before your next appointment, please call your pharmacy*   Lab Work: None If you have labs (blood work) drawn today and your tests are completely normal, you will receive your results only by: MyChart Message (if you have MyChart) OR A paper copy in the mail If you have any lab test that is abnormal or we need to change your treatment, we will call you to review the results.   Testing/Procedures: Zio- 14 days   Follow-Up: At [facility], you and your health needs are our priority.  As part of our continuing mission to provide you with exceptional heart care, we have created designated Provider Care Teams.  These Care Teams include your primary Cardiologist (physician) and Advanced Practice Providers (APPs -  Physician Assistants and Nurse Practitioners) who all work together to provide you with the care you need, when you need it.  We recommend signing up for the patient portal called "MyChart".  Sign up information is provided on this After Visit Summary.  MyChart is used to connect with patients for Virtual Visits (Telemedicine).  Patients are able to view lab/test results, encounter notes, upcoming appointments, etc.  Non-urgent messages can be sent to your provider as well.   To learn more about what you can do with MyChart, go to [email or web address].    Your next appointment:   Follow up is pending testing results   Provider:   Luane School, MD    Other Instructions ZIO XT- Long Term Monitor Instructions   Your physician has requested you wear your ZIO patch monitor___14____days.   This is a single patch monitor.  [employer] supplies one patch monitor per enrollment.  Additional stickers are not available.   Please do not apply patch if  you will be having a Nuclear Stress Test, Echocardiogram, Cardiac CT, MRI, or Chest Xray during the time frame you would be wearing the monitor. The patch cannot be worn during these tests.  You cannot remove and re-apply the ZIO XT patch monitor.   Your ZIO patch monitor will be sent USPS Priority mail from [facility] directly to your home address. The monitor may also be mailed to a PO BOX if home delivery is not available.   It may take 3-5 days to receive your monitor after you have been enrolled.   Once you have received you monitor, please review enclosed instructions.  Your monitor has already been registered assigning a specific monitor serial # to you.   Applying the monitor   Shave hair from upper left chest.   Hold abrader disc by orange tab.  Rub abrader in 40 strokes over left upper chest as indicated in your monitor instructions.   Clean area with 4 enclosed alcohol pads .  Use all pads to assure are is cleaned thoroughly.  Let dry.   Apply patch as indicated in monitor instructions.  Patch will be place under collarbone on left side of chest with arrow pointing upward.   Rub patch adhesive wings for 2 minutes.Remove white label marked "1".  Remove white label marked "2".  Rub patch adhesive wings for 2 additional minutes.   While looking in a mirror, press and release button in center of patch.  A small green light will flash 3-4 times .  This will be  your only indicator the monitor has been turned on.     Do not shower for the first 24 hours.  You may shower after the first 24 hours.   Press button if you feel a symptom. You will hear a small click.  Record Date, Time and Symptom in the Patient Log Book.   When you are ready to remove patch, follow instructions on last 2 pages of Patient Log Book.  Stick patch monitor onto last page of Patient Log Book.   Place Patient Log Book in [place] box.  Use locking tab on box and tape box closed securely.  The [employer] has [employer] on it.  Please place in mailbox as soon as possible.  Your physician should have your test results approximately 7 days after the monitor has been mailed back to [facility].   Call [facility] Customer Care at [phone] if you have questions regarding your ZIO XT patch monitor.  Call them immediately if you see an orange light blinking on your monitor.   If your monitor falls off in less than 4 days contact our Monitor department at [phone].  If your monitor becomes loose or falls off after 4 days call [employer] at [phone] for suggestions on securing your monitor.
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
## 2023-03-20 DIAGNOSIS — R002 Palpitations: Secondary | ICD-10-CM | POA: Diagnosis not present

## 2023-03-22 ENCOUNTER — Telehealth: Payer: Self-pay | Admitting: Internal Medicine

## 2023-03-22 NOTE — Telephone Encounter (Signed)
Spoke with pt who states that she is to have a MRI on Friday. Pt states that she just placed her monitor on Tuesday. Pt would like to know if she should remove monitor on Friday. She states that the monitor is loose on one side. Please advise.

## 2023-03-22 NOTE — Telephone Encounter (Signed)
New MessageL       Patient says she is having a MRI on Friday(03-24-23). She is wearing a Cytogeneticist. She wants to know if she can remove the Zio Patch when she she have the MRI?

## 2023-03-23 NOTE — Telephone Encounter (Signed)
Monitor is not re-attachable. Okay to order new monitor for after MRI?

## 2023-03-23 NOTE — Telephone Encounter (Signed)
[employer] will mail patient a new monitor. Pt notified and verbalized understanding.

## 2023-03-24 ENCOUNTER — Ambulatory Visit (HOSPITAL_COMMUNITY): Admission: RE | Admit: 2023-03-24 | Payer: 59 | Source: Ambulatory Visit

## 2023-03-24 ENCOUNTER — Ambulatory Visit (HOSPITAL_COMMUNITY): Payer: 59

## 2023-04-19 ENCOUNTER — Ambulatory Visit (HOSPITAL_COMMUNITY)
Admission: RE | Admit: 2023-04-19 | Discharge: 2023-04-19 | Disposition: A | Discharge status: Home / Self Care | Payer: 59 | Source: Ambulatory Visit | Attending: Podiatry | Admitting: Podiatry

## 2023-04-19 DIAGNOSIS — M79671 Pain in right foot: Secondary | ICD-10-CM

## 2023-04-19 DIAGNOSIS — R229 Localized swelling, mass and lump, unspecified: Secondary | ICD-10-CM

## 2023-04-19 DIAGNOSIS — M79672 Pain in left foot: Secondary | ICD-10-CM

## 2023-04-24 ENCOUNTER — Telehealth: Payer: Self-pay | Admitting: *Deleted

## 2023-04-24 DIAGNOSIS — R002 Palpitations: Secondary | ICD-10-CM

## 2023-04-24 MED ORDER — METOPROLOL TARTRATE 25 MG PO TABS: 25.0000 mg | ORAL_TABLET | Freq: Two times a day (BID) | ORAL | 3 refills | Status: DC

## 2023-04-24 NOTE — Telephone Encounter (Signed)
-----   Message from Marjo Bicker, MD sent at 04/24/2023 12:41 PM EDT ----- Event monitor is remarkable for 15.4% PVC burden. Symptoms correlated with PVC and ventricular trigeminy. Start metoprolol tartrate 25 mg twice daily. Obtain 2D echocardiogram.  Schedule follow-up in 6 months.

## 2023-04-24 NOTE — Telephone Encounter (Signed)
Pt notified and orders placed  

## 2023-05-10 ENCOUNTER — Other Ambulatory Visit (HOSPITAL_COMMUNITY): Payer: Self-pay | Admitting: Family Medicine

## 2023-05-10 DIAGNOSIS — Z1231 Encounter for screening mammogram for malignant neoplasm of breast: Secondary | ICD-10-CM

## 2023-05-11 ENCOUNTER — Encounter: Payer: Self-pay | Admitting: *Deleted

## 2023-05-12 ENCOUNTER — Ambulatory Visit: Payer: 59 | Admitting: Podiatry

## 2023-06-07 ENCOUNTER — Ambulatory Visit (HOSPITAL_COMMUNITY): Payer: 59

## 2023-06-19 ENCOUNTER — Ambulatory Visit: Payer: 59 | Admitting: Podiatry

## 2023-07-31 ENCOUNTER — Encounter: Payer: Self-pay | Admitting: Podiatry

## 2023-07-31 ENCOUNTER — Ambulatory Visit (INDEPENDENT_AMBULATORY_CARE_PROVIDER_SITE_OTHER): Payer: 59 | Admitting: Podiatry

## 2023-07-31 DIAGNOSIS — G5761 Lesion of plantar nerve, right lower limb: Secondary | ICD-10-CM | POA: Diagnosis not present

## 2023-07-31 DIAGNOSIS — G5762 Lesion of plantar nerve, left lower limb: Secondary | ICD-10-CM
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
## 2023-08-01 ENCOUNTER — Telehealth: Payer: Self-pay | Admitting: Podiatry

## 2023-08-01 NOTE — Telephone Encounter (Signed)
DOS-08/17/2023  EXC. GANGLION / TUMOR BILAT-28090 NEURECTOMY BILAT- [id number]  [employer] EFFECTIVE DATE-  05/15/2023  DEDUCTIBLE- $240.00 WITH REMAINING $0.00 OOP- $8850.00 WITH REMAINING $8371.09 COINSURANCE- 20%  PER THE [employer] WEBSITE PORTAL, PRIOR AUTHORIZATION IS NOT REQUIRED FOR CPT CODES 28090 AND 28080. GOOD FROM 08/17/2023 - 11/14/2023.  AUTH Decision ID #: [id number]

## 2023-08-03 ENCOUNTER — Telehealth: Payer: Self-pay | Admitting: Podiatry

## 2023-08-03 NOTE — Telephone Encounter (Signed)
CALLED THE NUMBER ON PTS [employer] C[employer] TO CHECK IF PRIOR AUTH IS REQUIRED FOR PTS SURGERY. THE ONLY NUMBER ON THE CARD WAS FOR CRISIS LINE, SO REPRESENTATIVE TRANSFERRED ME TO AUTH TEAM. WHEN SPEAKING TO [employer] REP, SHE STATED THAT AUTH REQUESTS NEED TO BE ENTERED IN ON THE PROVIDER PORTAL FOR PROCEDURES AFTER 08/15/2023. WHEN TRYING TO USE THE PROVIDER PORTAL, OUR PROVIDERS WERE NOT ON THE LIST BUT OVER THE PHONE AUTH IS NOT AN OPTION PER THE REPRESENTATIVE. THE [employer] AUTH REP STATED SHE WAS NOT ABLE TO OFFER MORE HELP AS TO ENTERING THE AUTH.

## 2023-08-23 ENCOUNTER — Encounter: Payer: 59 | Admitting: Podiatry

## 2023-08-30 ENCOUNTER — Encounter: Payer: 59 | Admitting: Podiatry

## 2023-09-13 ENCOUNTER — Encounter: Payer: 59 | Admitting: Podiatry

## 2023-09-14 DIAGNOSIS — G5763 Lesion of plantar nerve, bilateral lower limbs: Secondary | ICD-10-CM | POA: Diagnosis not present

## 2023-09-14 DIAGNOSIS — M67471 Ganglion, right ankle and foot: Secondary | ICD-10-CM | POA: Diagnosis not present

## 2023-09-14 DIAGNOSIS — M67472 Ganglion, left ankle and foot: Secondary | ICD-10-CM | POA: Diagnosis not present

## 2023-09-18 ENCOUNTER — Telehealth: Payer: Self-pay | Admitting: Podiatry

## 2023-09-18 NOTE — Telephone Encounter (Signed)
Pt called stating she had surgery on 10/31 and is scheduled for pov # 1 on 11/6 but cannot come that day as he husband has 2 appts that day. She stated she is available on 11/12 or 11/13. I explained that I am not to change the first post op appts and would send a message to have someone call her back about that appt.

## 2023-09-20 ENCOUNTER — Encounter: Payer: 59 | Admitting: Podiatry

## 2023-09-26 ENCOUNTER — Ambulatory Visit (INDEPENDENT_AMBULATORY_CARE_PROVIDER_SITE_OTHER): Payer: 59 | Admitting: Podiatry

## 2023-09-26 ENCOUNTER — Other Ambulatory Visit: Payer: Self-pay | Admitting: Podiatry

## 2023-09-26 ENCOUNTER — Ambulatory Visit: Payer: 59

## 2023-09-26 ENCOUNTER — Encounter: Payer: Self-pay | Admitting: Podiatry

## 2023-09-26 ENCOUNTER — Ambulatory Visit (INDEPENDENT_AMBULATORY_CARE_PROVIDER_SITE_OTHER): Payer: 59

## 2023-09-26 VITALS — Ht 63.5 in | Wt 184.0 lb

## 2023-09-26 DIAGNOSIS — G5761 Lesion of plantar nerve, right lower limb: Secondary | ICD-10-CM | POA: Diagnosis not present

## 2023-09-26 DIAGNOSIS — G5762 Lesion of plantar nerve, left lower limb: Secondary | ICD-10-CM | POA: Diagnosis not present
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
## 2023-09-27 ENCOUNTER — Encounter: Payer: 59 | Admitting: Podiatry

## 2023-10-04 ENCOUNTER — Encounter: Payer: 59 | Admitting: Podiatry

## 2023-10-09 ENCOUNTER — Encounter: Payer: 59 | Admitting: Podiatry

## 2023-11-01 ENCOUNTER — Ambulatory Visit: Payer: 59 | Admitting: Internal Medicine

## 2023-11-06 ENCOUNTER — Other Ambulatory Visit (HOSPITAL_COMMUNITY): Payer: Self-pay | Admitting: Family Medicine

## 2023-11-06 DIAGNOSIS — Z122 Encounter for screening for malignant neoplasm of respiratory organs: Secondary | ICD-10-CM

## 2023-11-06 DIAGNOSIS — Z720 Tobacco use: Secondary | ICD-10-CM

## 2023-11-13 ENCOUNTER — Encounter (INDEPENDENT_AMBULATORY_CARE_PROVIDER_SITE_OTHER): Payer: Self-pay | Admitting: *Deleted

## 2023-11-21 ENCOUNTER — Encounter: Payer: 59 | Admitting: Podiatry

## 2023-12-08 ENCOUNTER — Encounter (HOSPITAL_COMMUNITY): Payer: Self-pay

## 2023-12-08 ENCOUNTER — Ambulatory Visit (HOSPITAL_COMMUNITY): Admission: RE | Admit: 2023-12-08 | Payer: 59 | Source: Ambulatory Visit

## 2024-02-12 ENCOUNTER — Other Ambulatory Visit: Payer: Self-pay | Admitting: Internal Medicine

## 2024-03-05 NOTE — Progress Notes (Deleted)
  Cardiology Office Note:  .   Date:  03/05/2024  ID:  [name], DOB Mar 09, 1971, MRN [id number] PCP: Gwenyth Leo, FNP  [facility] Providers Cardiologist:  Lasalle Pointer, MD { Click to update primary MD,subspecialty MD or APP then REFRESH:1}   History of Present Illness: .   [name] is a 53 y.o. female with history of tobacco abuse, family history of CAD. Patient saw Dr. Mallipeddi 03/2023 for palpitations and ordered zio and CT calcium score but she declined that test due to fixed income. Monitor showed 15% PVC burden and metoprolol  started. Echo ordered but not done.  ROS: ***  Studies Reviewed: Aaron Aas         Prior CV Studies: {Select studies to display:26339}  Monitor 03/2023  Normal sinus rhythm ranging between 61 and 138 bpm with average HR 82 bpm   No atrial or ventricular arrhythmias   No AV block or pauses   <1% PAC and 15.4% PVC burden   Patient triggered events correlated with NSR (82-114), ventricular trigeminy and ventricular ectopy    Risk Assessment/Calculations:   {Does this patient have ATRIAL FIBRILLATION?[phone]} No BP recorded.  {Refresh Note OR Click here to enter BP  :1}***       Physical Exam:   VS:  There were no vitals taken for this visit.   Wt Readings from Last 3 Encounters:  09/26/23 184 lb (83.5 kg)  03/16/23 184 lb (83.5 kg)  03/31/18 201 lb (91.2 kg)    GEN: Well nourished, well developed in no acute distress NECK: No JVD; No carotid bruits CARDIAC: ***RRR, no murmurs, rubs, gallops RESPIRATORY:  Clear to auscultation without rales, wheezing or rhonchi  ABDOMEN: Soft, non-tender, non-distended EXTREMITIES:  No edema; No deformity   ASSESSMENT AND PLAN: .    Palpitations/PVC 15% started on metoprolol  -echo ordered but not done  Family history of early CAD-Coronary calcium score recommended  Tobacco abuse     {Are you ordering a CV Procedure (e.g. stress test, cath, DCCV, TEE, etc)?   Press F2        :[id number]}   Dispo: ***  Signed, Theotis Flake, PA-C

## 2024-03-18 ENCOUNTER — Encounter: Payer: Self-pay | Admitting: Physician Assistant

## 2024-03-18 ENCOUNTER — Ambulatory Visit: Attending: Physician Assistant | Admitting: Physician Assistant

## 2024-03-23 ENCOUNTER — Other Ambulatory Visit: Payer: Self-pay | Admitting: Internal Medicine

## 2024-04-19 ENCOUNTER — Ambulatory Visit (HOSPITAL_COMMUNITY)
Admission: RE | Admit: 2024-04-19 | Discharge: 2024-04-19 | Disposition: A | Discharge status: Home / Self Care | Source: Ambulatory Visit | Attending: Family Medicine | Admitting: Family Medicine

## 2024-04-19 ENCOUNTER — Encounter (HOSPITAL_COMMUNITY): Payer: Self-pay

## 2024-04-19 DIAGNOSIS — Z1231 Encounter for screening mammogram for malignant neoplasm of breast: Secondary | ICD-10-CM | POA: Insufficient documentation

## 2024-04-24 ENCOUNTER — Other Ambulatory Visit (HOSPITAL_COMMUNITY): Payer: Self-pay | Admitting: Family Medicine

## 2024-04-24 DIAGNOSIS — N63 Unspecified lump in unspecified breast: Secondary | ICD-10-CM

## 2024-05-01 ENCOUNTER — Other Ambulatory Visit: Payer: Self-pay | Admitting: Internal Medicine

## 2024-05-21 ENCOUNTER — Ambulatory Visit (HOSPITAL_COMMUNITY)
Admission: RE | Admit: 2024-05-21 | Discharge: 2024-05-21 | Disposition: A | Discharge status: Home / Self Care | Source: Ambulatory Visit | Attending: Family Medicine | Admitting: Family Medicine

## 2024-05-21 DIAGNOSIS — N63 Unspecified lump in unspecified breast: Secondary | ICD-10-CM

## 2024-05-21 DIAGNOSIS — N644 Mastodynia: Secondary | ICD-10-CM | POA: Diagnosis present

## 2024-05-21 DIAGNOSIS — N6311 Unspecified lump in the right breast, upper outer quadrant: Secondary | ICD-10-CM | POA: Diagnosis not present

## 2024-07-17 ENCOUNTER — Other Ambulatory Visit: Payer: Self-pay | Admitting: Internal Medicine
